# Patient Record
Sex: Female | Born: 1955 | Race: White | Hispanic: No | Marital: Married | Smoking: Never smoker
Health system: Southern US, Community
[De-identification: ages and names within clinical notes are randomized; demographics above are authoritative.]

## PROBLEM LIST (undated history)

## (undated) DIAGNOSIS — M1990 Unspecified osteoarthritis, unspecified site: Secondary | ICD-10-CM

## (undated) DIAGNOSIS — K8020 Calculus of gallbladder without cholecystitis without obstruction: Secondary | ICD-10-CM

## (undated) DIAGNOSIS — E079 Disorder of thyroid, unspecified: Secondary | ICD-10-CM

## (undated) DIAGNOSIS — E785 Hyperlipidemia, unspecified: Secondary | ICD-10-CM

## (undated) DIAGNOSIS — F32A Depression, unspecified: Secondary | ICD-10-CM

## (undated) DIAGNOSIS — F329 Major depressive disorder, single episode, unspecified: Secondary | ICD-10-CM

## (undated) DIAGNOSIS — E039 Hypothyroidism, unspecified: Secondary | ICD-10-CM

## (undated) HISTORY — PX: CHOLECYSTECTOMY: SHX55

## (undated) HISTORY — DX: Calculus of gallbladder without cholecystitis without obstruction: K80.20

## (undated) HISTORY — PX: APPENDECTOMY: SHX54

## (undated) HISTORY — DX: Disorder of thyroid, unspecified: E07.9

## (undated) HISTORY — DX: Depression, unspecified: F32.A

## (undated) HISTORY — DX: Unspecified osteoarthritis, unspecified site: M19.90

## (undated) HISTORY — DX: Hypothyroidism, unspecified: E03.9

## (undated) HISTORY — DX: Major depressive disorder, single episode, unspecified: F32.9

## (undated) HISTORY — PX: VAGINAL HYSTERECTOMY: SUR661

## (undated) HISTORY — DX: Hyperlipidemia, unspecified: E78.5

---

## 2009-10-02 ENCOUNTER — Ambulatory Visit: Payer: Self-pay | Admitting: Family Medicine

## 2009-10-02 DIAGNOSIS — F329 Major depressive disorder, single episode, unspecified: Secondary | ICD-10-CM

## 2009-10-02 DIAGNOSIS — F32A Depression, unspecified: Secondary | ICD-10-CM | POA: Insufficient documentation

## 2009-10-02 DIAGNOSIS — N951 Menopausal and female climacteric states: Secondary | ICD-10-CM | POA: Insufficient documentation

## 2009-10-02 DIAGNOSIS — E039 Hypothyroidism, unspecified: Secondary | ICD-10-CM | POA: Insufficient documentation

## 2009-10-03 ENCOUNTER — Encounter: Payer: Self-pay | Admitting: Family Medicine

## 2009-10-06 LAB — CONVERTED CEMR LAB
ALT: 14 units/L (ref 0–35)
AST: 16 units/L (ref 0–37)
Albumin: 4.3 g/dL (ref 3.5–5.2)
Alkaline Phosphatase: 66 units/L (ref 39–117)
BUN: 12 mg/dL (ref 6–23)
CO2: 27 meq/L (ref 19–32)
Calcium: 9.3 mg/dL (ref 8.4–10.5)
Chloride: 102 meq/L (ref 96–112)
Cholesterol: 222 mg/dL — ABNORMAL HIGH (ref 0–200)
Creatinine, Ser: 0.95 mg/dL (ref 0.40–1.20)
Estradiol: 126.2 pg/mL
FSH: 56.4 milliintl units/mL
Glucose, Bld: 96 mg/dL (ref 70–99)
HDL: 76 mg/dL (ref >39)
LDL Cholesterol: 132 mg/dL — ABNORMAL HIGH (ref 0–99)
LH: 59.8 milliintl units/mL
Potassium: 4.5 meq/L (ref 3.5–5.3)
Progesterone: 0.5 ng/mL
Sodium: 139 meq/L (ref 135–145)
TSH: 3.988 microintl units/mL (ref 0.350–4.500)
Total Bilirubin: 0.5 mg/dL (ref 0.3–1.2)
Total CHOL/HDL Ratio: 2.9
Total Protein: 6.9 g/dL (ref 6.0–8.3)
Triglycerides: 71 mg/dL (ref <150)
VLDL: 14 mg/dL (ref 0–40)

## 2009-10-08 ENCOUNTER — Telehealth: Payer: Self-pay | Admitting: Family Medicine

## 2009-10-13 ENCOUNTER — Encounter: Admission: RE | Admit: 2009-10-13 | Discharge: 2009-10-13 | Payer: Self-pay | Admitting: Family Medicine

## 2009-10-27 ENCOUNTER — Ambulatory Visit: Payer: Self-pay | Admitting: Family Medicine

## 2009-10-28 LAB — CONVERTED CEMR LAB
TSH: 1.821 microintl units/mL (ref 0.350–4.500)
Vit D, 25-Hydroxy: 37 ng/mL (ref 30–89)

## 2009-11-18 ENCOUNTER — Telehealth: Payer: Self-pay | Admitting: Family Medicine

## 2009-12-30 ENCOUNTER — Encounter: Payer: Self-pay | Admitting: Family Medicine

## 2010-03-12 ENCOUNTER — Encounter: Payer: Self-pay | Admitting: Family Medicine

## 2010-03-12 LAB — CONVERTED CEMR LAB: TSH: 1.5 microintl units/mL

## 2010-03-19 ENCOUNTER — Ambulatory Visit: Payer: Self-pay | Admitting: Family Medicine

## 2010-04-06 ENCOUNTER — Telehealth: Payer: Self-pay | Admitting: Family Medicine

## 2010-05-13 ENCOUNTER — Encounter: Payer: Self-pay | Admitting: Family Medicine

## 2010-07-01 ENCOUNTER — Encounter: Payer: Self-pay | Admitting: Family Medicine

## 2010-07-24 ENCOUNTER — Ambulatory Visit: Payer: Self-pay | Admitting: Family Medicine

## 2010-07-24 ENCOUNTER — Encounter: Payer: Self-pay | Admitting: Family Medicine

## 2010-07-24 ENCOUNTER — Encounter
Admission: RE | Admit: 2010-07-24 | Discharge: 2010-07-24 | Payer: Self-pay | Source: Home / Self Care | Attending: Family Medicine | Admitting: Family Medicine

## 2010-07-24 DIAGNOSIS — Z9089 Acquired absence of other organs: Secondary | ICD-10-CM | POA: Insufficient documentation

## 2010-07-24 DIAGNOSIS — M2550 Pain in unspecified joint: Secondary | ICD-10-CM | POA: Insufficient documentation

## 2010-07-24 DIAGNOSIS — M79609 Pain in unspecified limb: Secondary | ICD-10-CM | POA: Insufficient documentation

## 2010-07-28 LAB — CONVERTED CEMR LAB
ANA Titer 1: NEGATIVE
Anti Nuclear Antibody(ANA): POSITIVE — AB
Basophils Absolute: 0 10*3/uL (ref 0.0–0.1)
Basophils Relative: 1 % (ref 0–1)
Eosinophils Absolute: 0.2 10*3/uL (ref 0.0–0.7)
Eosinophils Relative: 3 % (ref 0–5)
HCT: 42.9 % (ref 36.0–46.0)
Hemoglobin: 14.3 g/dL (ref 12.0–15.0)
Lymphocytes Relative: 27 % (ref 12–46)
Lymphs Abs: 1.6 10*3/uL (ref 0.7–4.0)
MCHC: 33.3 g/dL (ref 30.0–36.0)
MCV: 96 fL (ref 78.0–100.0)
Monocytes Absolute: 0.6 10*3/uL (ref 0.1–1.0)
Monocytes Relative: 10 % (ref 3–12)
Neutro Abs: 3.5 10*3/uL (ref 1.7–7.7)
Neutrophils Relative %: 60 % (ref 43–77)
Platelets: 419 10*3/uL — ABNORMAL HIGH (ref 150–400)
RBC: 4.47 M/uL (ref 3.87–5.11)
RDW: 12.7 % (ref 11.5–15.5)
Rhuematoid fact SerPl-aCnc: <20 intl units/mL (ref 0–20)
Sed Rate: 6 mm/hr (ref 0–22)
WBC: 5.8 10*3/uL (ref 4.0–10.5)

## 2010-09-01 ENCOUNTER — Ambulatory Visit
Admission: RE | Admit: 2010-09-01 | Discharge: 2010-09-01 | Payer: Self-pay | Source: Home / Self Care | Attending: Family Medicine | Admitting: Family Medicine

## 2010-09-01 ENCOUNTER — Telehealth: Payer: Self-pay | Admitting: Family Medicine

## 2010-09-01 DIAGNOSIS — J209 Acute bronchitis, unspecified: Secondary | ICD-10-CM | POA: Insufficient documentation

## 2010-09-15 NOTE — Progress Notes (Signed)
Summary: Wellbutrin  Phone Note Call from Patient Call back at Home Phone [phone]   Caller: Patient Call For: Nani Gasser MD Summary of Call: Pt on genmeric Wellbutrin and gets it from a specific company because its the only one that her body can take and [facility] told her the company has hold on it until March- she is out of pills and doesn't know what to do Initial call taken by: Kathlene November,  October 08, 2009 3:11 PM  Follow-up for Phone Call        Will just have to take another brand until then.  Follow-up by: Nani Gasser MD,  October 08, 2009 4:37 PM  Additional Follow-up for Phone Call Additional follow up Details #1::        Pt would like to change med said ya'll had discussed one that helped with menopause. Send to [facility] in [place] Additional Follow-up by: Kathlene November,  October 08, 2009 4:40 PM    Additional Follow-up for Phone Call Additional follow up Details #2::    OK, will start effexor.  Needto f/u in 3 weeks.  Follow-up by: Nani Gasser MD,  October 08, 2009 4:42 PM  New/Updated Medications: VENLAFAXINE HCL 37.5 MG TABS (VENLAFAXINE HCL) Take 1 tablet by mouth two times a day for one week then increase to 2 tabs by mouth two times a day Prescriptions: VENLAFAXINE HCL 37.5 MG TABS (VENLAFAXINE HCL) Take 1 tablet by mouth two times a day for one week then increase to 2 tabs by mouth two times a day  #30 days sup x 0   Entered and Authorized by:   Nani Gasser MD   Signed by:   Nani Gasser MD on 10/08/2009   Method used:   Electronically to        [facility]* (retail)       [address] [place], [place]  [address]       Ph: [phone]       Fax: [phone]   RxID:   [id number]

## 2010-09-15 NOTE — Assessment & Plan Note (Signed)
Summary: NOV: Thyroid, mood   Vital Signs:  Patient profile:   55 year old female Height:      65.5 inches Weight:      149 pounds BMI:     24.51 Pulse rate:   85 / minute BP sitting:   135 / 75  (left arm) Cuff size:   regular  Vitals Entered By: Kathlene November (October 02, 2009 10:51 AM) CC: Np- get established and needs refills on meds Is Patient Diabetic? No   Primary Care Provider:  Nani Gasser MD  CC:  Np- get established and needs refills on meds.  History of Present Illness: Np- get established and needs refills on meds.   [place] Feels like she did when first discovered her thyroid problem.  Feels so tired can't get out of bed. Does feel she is peri-menopuasal. Has post partum thyroiditis in 1988. Was fine until a couple of years ago. Mom is hypothyroid.  No skin changes. NO hair changes.  Last level was checked in one week. No thoughts of suicide. sister committed suicide about 1 year ago. Plans on starting counseling. Plans on starting to work out.  Having carb cravings. Has been gaining weight. Not sleepng well since moved here.   Habits & Providers  Alcohol-Tobacco-Diet     Alcohol drinks/day: <1     Tobacco Status: never  Exercise-Depression-Behavior     Does Patient Exercise: yes     Type of exercise: walk, eliptical, treadmill     Exercise (avg: min/session): <30     Times/week: 3     STD Risk: never     Drug Use: no  Past History:  Past Medical History: None  Past Surgical History: 2 ectopic pregnancies 1980s c/sec 1987 Partial hysterectomy 2000 Cholecystectomy  2009  Family History: Sister commited suicide in 2009, Bipolar Mom, Dad with high cholesterol  Father with HTN Mom was on DES during pregnancy with her  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  Married to [place] with 2 kids.  Oldest son with Aspergers, youngest son with Tourettes, both adopted.   Never Smoked Alcohol  use-yes Drug use-no Regular exercise-yes Smoking Status:  never Does Patient Exercise:  yes STD Risk:  never Drug Use:  no  Review of Systems       + fever/sweats/weakness, +  unexplained weight loss/gain.  No vison changes.  No difficulty hearing/ringing in ears, hay fever/allergies.  No chest pain/discomfort, palpitations.  No Br lump/nipple discharge.  No cough/wheeze.  No blood in BM, nausea/vomiting/diarrhea.  No nighttime urination, leaking urine, unusual vaginal bleeding, discharge (penis or vagina).  No muscle/joint pain. No rash, change in mole.  No HA, memory loss.  No anxiety, sleep d/o, depression.  No easy bruising/bleeding, unexplained lump   Physical Exam  General:  Well-developed,well-nourished,in no acute distress; alert,appropriate and cooperative throughout examination Head:  Normocephalic and atraumatic without obvious abnormalities. No apparent alopecia or balding. Eyes:  No corneal or conjunctival inflammation noted. EOMI. Perrla.  Neck:  No deformities, masses, or tenderness noted. NOTM.  Lungs:  Normal respiratory effort, chest expands symmetrically. Lungs are clear to auscultation, no crackles or wheezes. Heart:  Normal rate and regular rhythm. S1 and S2 normal without gallop, murmur, click, rub or other extra sounds. Skin:  no rashes.   Psych:  Cognition and judgment appear intact. Alert and cooperative with normal attention span and concentration. No apparent delusions, illusions, hallucinations   Impression &  Recommendations:  Problem # 1:  UNSPECIFIED HYPOTHYROIDISM (ICD-244.9) Will check levels and make sure dose is appropriate. Some 9of her sxs may be from depression as well as postmenopause. She would like her hromones rechecked.   Her updated medication list for this problem includes:    Levothroid 75 Mcg Tabs (Levothyroxine sodium) .Marland Kitchen... Take 1 tablet by mouth once a day  Orders: T-TSH ([id number])  Problem # 2:  DEPRESSION (ICD-311) PHQ-9 today  is 11 (moderate). Neg screen for Bipolar. Clearly she has had alot of acute and lifelong stressors. I did encourage counseling esp since she has been thinking about it any way. Also discussed that wellbuytrin may not be enought to really control her sxs right now.  She says the 300mg  dose gave her tremors. Even discussed something like and SNRI like effexor that may help with menopausal sxs and depression at the same. time. Did encourage her to start working out as well.This may help with her carb cravings and poor sleep as well.  Her updated medication list for this problem includes:    Wellbutrin Sr 150 Mg Xr12h-tab (Bupropion hcl) .Marland Kitchen... Take one tablet by mouth once a day  Problem # 3:  MENOPAUSAL SYNDROME (ICD-627.2) il check levels.  Orders: T-Estradiol [id number]) T-Progesterone (30865) T-FSH [id number]) T-LH [id number])  Complete Medication List: 1)  Levothroid 75 Mcg Tabs (Levothyroxine sodium) .... Take 1 tablet by mouth once a day 2)  Wellbutrin Sr 150 Mg Xr12h-tab (Bupropion hcl) .... Take one tablet by mouth once a day 3)  Fruity Chewables Multivitamin Chew (Pediatric multiple vit-c-fa) .... Take one tablet by mouth once a day  Other Orders: T-Comprehensive Metabolic Panel [id number]) T-Lipid Profile ([id number]) T-Mammography Bilateral Screening (87564)

## 2010-09-15 NOTE — Letter (Signed)
Summary: [facility]  [facility]   Imported By: Lanelle Bal 05/21/2010 09:32:28  _____________________________________________________________________  External Attachment:    Type:   Image     Comment:   External Document

## 2010-09-15 NOTE — Assessment & Plan Note (Signed)
Summary: Hosp f/u for appendectomy   Vital Signs:  Patient profile:   55 year old female Height:      65.5 inches Weight:      149 pounds Pulse rate:   78 / minute BP sitting:   129 / 81  (right arm) Cuff size:   regular  Vitals Entered By: Avon Gully CMA, Duncan Dull) (July 24, 2010 9:49 AM) CC: hosp f/u, had appendix taken out in [place]   Primary Care Provider:  Nani Gasser MD  CC:  hosp f/u and had appendix taken out in [place].  History of Present Illness: hosp f/u, had appendix taken out in [place].  Had some wine over thanksgiving and thought initally stomach upset but then became RLQ pain.  Went to the ED. No complications.  Post op doing well.  Dec appetite. No fever. No change in bowels.    Hands have been painful.  Painful in the fingers not the knuckles.  Occ her knees bother her.  Had an rheum w/u 5-10 years ago adn told had elevated ANA but no rheum arthritis.  NO swelling or redness. Usually uses OTC meds for pain relief.   Current Medications (verified): 1)  Synthroid 100 Mcg Tabs (Levothyroxine Sodium) .... Take 1 Tablet By Mouth Once A Day 2)  Fruity Chewables Multivitamin  Chew (Pediatric Multiple Vit-C-Fa) .... Take One Tablet By Mouth Once A Day 3)  Wellbutrin Xl 150 Mg Xr24h-Tab (Bupropion Hcl) .... Take 1 Tablet By Mouth Once A Day  Allergies (verified): 1)  ! Sulfa  Comments:  Nurse/Medical Assistant: The patient's medications and allergies were reviewed with the patient and were updated in the Medication and Allergy Lists. Avon Gully CMA, Duncan Dull) (July 24, 2010 9:50 AM)  Past History:  Past Surgical History: 2 ectopic pregnancies 1980s c/sec 1987 Partial hysterectomy 2000 Cholecystectomy  2009 Appedectomy 06/2010  Social History: Reviewed history from 10/27/2009 and no changes required. 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  Married to [place] with 2 kids. Eliberto Ivory is her sone.  Oldest son with Aspergers, youngest son with Tourettes, both  adopted.   Never Smoked Alcohol use-yes Drug use-no Regular exercise-yes  Physical Exam  General:  Well-developed,well-nourished,in no acute distress; alert,appropriate and cooperative throughout examination Lungs:  Normal respiratory effort, chest expands symmetrically. Lungs are clear to auscultation, no crackles or wheezes. Heart:  Normal rate and regular rhythm. S1 and S2 normal without gallop, murmur, click, rub or other extra sounds. Abdomen:  Some fullness in the RUQ.  Icision are healing well. Nontender. No drainage.  Msk:  Hands; Some of her DIPs are slighly enlarged but no swellin or redness or tenderness.  has some stiffnes and dec flexion of the thumbs bilaterally.  Finger wtih NROM.    Impression & Recommendations:  Problem # 1:  APPENDECTOMY, HX OF (ICD-V45.79) She is doing well overall.  AVoid liftingfor total fo 6 weeks. They aslo did an umbilical hernia repair.   Call if any probems, pain or fever or drainage for the wounds.  and we did call in records from  and [facility] in [place], [place]. These wre reviewed.   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 Will recheck labs and get plain xrays. If OA explained best and safest tx is exercises, keep hands warm, and use Tylneol for pain relief.  Orders: T-*Unlisted Diagnostic X-ray test/procedure ([id number])  Complete Medication List: 1)  Synthroid 100 Mcg Tabs (Levothyroxine  sodium) .... Take 1 tablet by mouth once a day 2)  Fruity Chewables Multivitamin Chew (Pediatric multiple vit-c-fa) .... Take one tablet by mouth once a day 3)  Wellbutrin Xl 150 Mg Xr24h-tab (Bupropion hcl) .... Take 1 tablet by mouth once a day  Other Orders: T-CBC w/Diff ([id number]) T-Sed Rate (Automated) [id number]) T-Rheumatoid Factor [id number]) [place] and Pattern ([id number])  Patient Instructions: 1)  No lifting greater than 10 lbs or bowling for total of 6 weeks post surgery.  2)  Call  if any problems on new onset pain.  3)  We will cal you with your xray and lab results.    Orders Added: 1)  T-*Unlisted Diagnostic X-ray test/procedure [[id number]] 2)  T-CBC w/Diff [[id number]] 3)  T-Sed Rate (Automated) [[id number]] 4)  T-Rheumatoid Factor [[id number]] 5)  T-ANA Titer and Pattern [86039-23905] 6)  Est. Patient Level IV [54627]

## 2010-09-15 NOTE — Letter (Signed)
Summary: Depression Questionnaire/[place]  Depression Questionnaire/[facility]   Imported By: Lanelle Bal 10/07/2009 13:09:00  _____________________________________________________________________  External Attachment:    Type:   Image     Comment:   External Document

## 2010-09-15 NOTE — Progress Notes (Signed)
Summary: Referral to endocrinologists  Phone Note Call from Patient Call back at Home Phone [phone]   Caller: Patient Call For: Stacey Gasser MD Summary of Call: Pt calls and would like a referral to see Dr. Jimmye Norman an endocrinologists in [place] for her thyroid. States still having trouble with it.Dr. Redmond [facility] # [phone] Initial call taken by: Kathlene November,  November 18, 2009 12:47 PM  Follow-up for Phone Call        OK.  Follow-up by: Stacey Gasser MD,  November 18, 2009 12:52 PM

## 2010-09-15 NOTE — Assessment & Plan Note (Signed)
Summary: FU THYROID, Mood   Vital Signs:  Patient profile:   55 year old female Height:      65.5 inches Weight:      150 pounds BMI:     24.67 O2 Sat:      99 % on Room air Pulse rate:   82 / minute BP sitting:   126 / 69  (left arm) Cuff size:   regular  Vitals Entered By: Payton Spark CMA (October 27, 2009 8:29 AM)  O2 Flow:  Room air CC: F/U.    Primary Care Provider:  Nani Gasser MD  CC:  F/U. Marland Kitchen  History of Present Illness: Pt doesn't do well on the brand, generic feels ineffective.  The generic also upsets her stomach so would like to try the branded. More irrtability off th emedication. The effexor was too sedating and made her nauseated.   Current Medications (verified): 1)  Levothroid 88 Mcg Tabs (Levothyroxine Sodium) .... Take 1 Tablet By Mouth Once A Day in The Am 2)  Fruity Chewables Multivitamin  Chew (Pediatric Multiple Vit-C-Fa) .... Take One Tablet By Mouth Once A Day  Allergies (verified): 1)  ! 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  Married to [place] with 2 kids. Eliberto Ivory is her sone.  Oldest son with Aspergers, youngest son with Tourettes, both adopted.   Never Smoked Alcohol use-yes Drug use-no Regular exercise-yes  Physical Exam  General:  Well-developed,well-nourished,in no acute distress; alert,appropriate and cooperative throughout examination Head:  Normocephalic and atraumatic without obvious abnormalities. No apparent alopecia or balding. Lungs:  Normal respiratory effort, chest expands symmetrically. Lungs are clear to auscultation, no crackles or wheezes. Heart:  Normal rate and regular rhythm. S1 and S2 normal without gallop, murmur, click, rub or other extra sounds. Skin:  no rashes.   Cervical Nodes:  No lymphadenopathy noted Psych:  Cognition and judgment appear intact. Alert and cooperative with normal attention span and concentration. No apparent delusions, illusions, hallucinations   Impression &  Recommendations:  Problem # 1:  UNSPECIFIED HYPOTHYROIDISM (ICD-244.9) Due to recheck. Has been sick with URI lately so hasn't been able to exercise and lose weight like she would like.  Her updated medication list for this problem includes:    Levothroid 88 Mcg Tabs (Levothyroxine sodium) .Marland Kitchen... Take 1 tablet by mouth once a day in the am  Orders: T-TSH [id number]) T-Vitamin D (25-Hydroxy) [phone])  Labs Reviewed: TSH: 3.988 (10/03/2009)    Chol: 222 (10/03/2009)   HDL: 76 (10/03/2009)   LDL: 132 (10/03/2009)   TG: 71 (10/03/2009)  Problem # 2:  DEPRESSION (ICD-311)  Not on Effexor. Would like to change to brand wellbutrin until the generic by TEVA is available. New rx sent.  The following medications were removed from the medication list:    Venlafaxine Hcl 37.5 Mg Tabs (Venlafaxine hcl) .Marland Kitchen... Take 1 tablet by mouth two times a day for one week then increase to 2 tabs by mouth two times a day Her updated medication list for this problem includes:    Wellbutrin Xl 150 Mg Xr24h-tab (Bupropion hcl) .Marland Kitchen... Take 1 tablet by mouth once a day  Orders: T-Vitamin D (25-Hydroxy) ([id number])  Complete Medication List: 1)  Levothroid 88 Mcg Tabs (Levothyroxine sodium) .... Take 1 tablet by mouth once a day in the am 2)  Fruity Chewables Multivitamin Chew (Pediatric multiple vit-c-fa) .... Take one tablet by mouth once a day 3)  Wellbutrin Xl 150 Mg Xr24h-tab (Bupropion hcl) .Marland KitchenMarland KitchenMarland Kitchen  Take 1 tablet by mouth once a day Prescriptions: WELLBUTRIN XL 150 MG XR24H-TAB (BUPROPION HCL) Take 1 tablet by mouth once a day Brand medically necessary #30 x 1   Entered and Authorized by:   Nani Gasser MD   Signed by:   Nani Gasser MD on 10/27/2009   Method used:   Electronically to        [facility] (retail)       [address] [place], [place]  [address]       Ph: [phone]       Fax: [phone]   RxID:   [id number]

## 2010-09-15 NOTE — Assessment & Plan Note (Signed)
Summary: sinus infection   Vital Signs:  Patient profile:   55 year old female Height:      65.5 inches Weight:      151 pounds Temp:     99.2 degrees F oral Pulse rate:   88 / minute BP sitting:   124 / 65  (left arm) Cuff size:   regular  Vitals Entered By: Avon Gully CMA, Duncan Dull) (March 19, 2010 11:32 AM) CC: sinus pressure and congestion x 2 weeks   Primary Care Toron Bowring:  Nani Gasser MD  CC:  sinus pressure and congestion x 2 weeks.  History of Present Illness: sinus pressure and congestion x 2 weeks. Chest discomfort as well. Took sinus cold medicine. Teeth have been huring. + HA.  Thought initally allergies. No fever.  Some blood intermittantly form the left nostril.  NO ear pain.   Current Medications (verified): 1)  Levothroid 88 Mcg Tabs (Levothyroxine Sodium) .... Take 1 Tablet By Mouth Once A Day in The Am 2)  Fruity Chewables Multivitamin  Chew (Pediatric Multiple Vit-C-Fa) .... Take One Tablet By Mouth Once A Day 3)  Wellbutrin Xl 150 Mg Xr24h-Tab (Bupropion Hcl) .... Take 1 Tablet By Mouth Once A Day  Allergies (verified): 1)  ! Sulfa  Comments:  Nurse/Medical Assistant: The patient's medications and allergies were reviewed with the patient and were updated in the Medication and Allergy Lists. Avon Gully CMA, Duncan Dull) (March 19, 2010 11:32 AM)  Physical Exam  General:  Well-developed,well-nourished,in no acute distress; alert,appropriate and cooperative throughout examination Head:  Normocephalic and atraumatic without obvious abnormalities. No apparent alopecia or balding. Eyes:  No corneal or conjunctival inflammation noted. EOMI. Perrla.  Ears:  External ear exam shows no significant lesions or deformities.  Otoscopic examination reveals clear canals, tympanic membranes are intact bilaterally without bulging, retraction, inflammation or discharge. Hearing is grossly normal bilaterally. Nose:  External nasal examination shows no deformity  or inflammation. Nasal mucosa are pink and moist without lesion.  Yellow discharge.  Mouth:  Oral mucosa and oropharynx without lesions or exudates.  Teeth in good repair. Neck:  No deformities, masses, or tenderness noted. Lungs:  Normal respiratory effort, chest expands symmetrically. Lungs are clear to auscultation, no crackles or wheezes. Heart:  Normal rate and regular rhythm. S1 and S2 normal without gallop, murmur, click, rub or other extra sounds. Skin:  Skin dry and cracked on her fingertips.  Cervical Nodes:  No lymphadenopathy noted Psych:  Cognition and judgment appear intact. Alert and cooperative with normal attention span and concentration. No apparent delusions, illusions, hallucinations   Impression & Recommendations:  Problem # 1:  SINUSITIS - ACUTE-NOS (ICD-461.9)  Her updated medication list for this problem includes:    Zithromax Z-pak 250 Mg Tabs (Azithromycin) .Marland Kitchen... Take as directed.  Instructed on treatment. Call if symptoms persist or worsen.   Problem # 2:  DRY SKIN (ICD-701.1) Has cracks on he fingertips that are inflammed. Used ot use a cream on it that worked to heal the area in 3-4 days but it was taken off the market. Now using triple abx oint and it is not helping. Will try bactroban. If not helping then can consider other tx.   Complete Medication List: 1)  Synthroid 100 Mcg Tabs (Levothyroxine sodium) .... Take 1 tablet by mouth once a day 2)  Fruity Chewables Multivitamin Chew (Pediatric multiple vit-c-fa) .... Take one tablet by mouth once a day 3)  Wellbutrin Xl 150 Mg Xr24h-tab (Bupropion hcl) .Marland KitchenMarland KitchenMarland Kitchen  Take 1 tablet by mouth once a day 4)  Zithromax Z-pak 250 Mg Tabs (Azithromycin) .... Take as directed. 5)  Bactroban 2 % Oint (Mupirocin) .... Apply two times a day to affected area.  Patient Instructions: 1)  Call if not better in 10 days.  Prescriptions: BACTROBAN 2 % OINT (MUPIROCIN) Apply two times a day to affected area.  #1 tube. x 0   Entered  and Authorized by:   Nani Gasser MD   Signed by:   Nani Gasser MD on 03/19/2010   Method used:   Electronically to        [facility] (retail)       [address] [place], [place]  [address]       Ph: [phone]       Fax: [phone]   RxID:   [id number] ZITHROMAX Z-PAK 250 MG TABS (AZITHROMYCIN) Take as directed.  #1 x 0   Entered and Authorized by:   Nani Gasser MD   Signed by:   Nani Gasser MD on 03/19/2010   Method used:   Electronically to        [facility] (retail)       [address] [place], [place]  [address]       Ph: [phone]       Fax: [phone]   RxID:   [id number] AMOXICILLIN 875 MG TABS (AMOXICILLIN) Take 1 tablet by mouth two times a day for 10 days.  #20 x 0   Entered and Authorized by:   Nani Gasser MD   Signed by:   Nani Gasser MD on 03/19/2010   Method used:   Electronically to        [facility] (retail)       [address] [place], [place]  [address]       Ph: [phone]       Fax: [phone]   RxID:   [id number]

## 2010-09-15 NOTE — Progress Notes (Signed)
Summary: sinus infec  Phone Note Call from Patient   Caller: Patient Call For: Nani Gasser MD Summary of Call: Pt called and states as soon as she finished the abx her head started hurting again and face hurts, no constant sinus drainage. please advise Initial call taken by: Avon Gully CMA, Duncan Dull),  April 06, 2010 3:49 PM  Follow-up for Phone Call        Will call in different ABX. If not better will need to f/u Follow-up by: Nani Gasser MD,  April 06, 2010 4:10 PM  Additional Follow-up for Phone Call Additional follow up Details #1::        called pt and notified of results Additional Follow-up by: Avon Gully CMA, Duncan Dull),  April 06, 2010 4:57 PM    New/Updated Medications: DOXYCYCLINE HYCLATE 100 MG CAPS (DOXYCYCLINE HYCLATE) Take 1 tablet by mouth two times a day for 10 days Prescriptions: DOXYCYCLINE HYCLATE 100 MG CAPS (DOXYCYCLINE HYCLATE) Take 1 tablet by mouth two times a day for 10 days  #20 x 0   Entered and Authorized by:   Nani Gasser MD   Signed by:   Nani Gasser MD on 04/06/2010   Method used:   Electronically to        [facility] (retail)       [address] [place], [place]  [address]       Ph: [phone]       Fax: [phone]   RxID:   [id number]

## 2010-09-15 NOTE — Letter (Signed)
Summary: Records Dated 11-19-03 thru 12-18-07/Joy Artis Flock MD  Records Dated 11-19-03 thru 12-18-07/Joy Artis Flock MD   Imported By: Lanelle Bal 10/30/2009 12:18:42  _____________________________________________________________________  External Attachment:    Type:   Image     Comment:   External Document

## 2010-09-17 NOTE — Progress Notes (Signed)
Summary: eczema  Phone Note Call from Patient   Caller: Patient Call For: Stacey Gasser MD Summary of Call: pt wanted to know if you were going to send a rx in for her eczema to her pharm Initial call taken by: Avon Gully CMA, Duncan Dull),  September 01, 2010 12:13 PM  Follow-up for Phone Call        Sorry, yes, just send over.  Follow-up by: Stacey Gasser MD,  September 01, 2010 12:14 PM  Additional Follow-up for Phone Call Additional follow up Details #1::        pt notifed Additional Follow-up by: Avon Gully CMA, Duncan Dull),  September 01, 2010 12:17 PM    New/Updated Medications: TRIAMCINOLONE ACETONIDE 0.1 % OINT (TRIAMCINOLONE ACETONIDE) Apply to area on her ears two times a day for 2 weeks. Prescriptions: TRIAMCINOLONE ACETONIDE 0.1 % OINT (TRIAMCINOLONE ACETONIDE) Apply to area on her ears two times a day for 2 weeks.  #20 grams x 0   Entered and Authorized by:   Stacey Gasser MD   Signed by:   Stacey Gasser MD on 09/01/2010   Method used:   Electronically to        [facility]* (retail)       [address] [place], [place]  [address]       Ph: [phone]       Fax: [phone]   RxID:   [id number]

## 2010-09-17 NOTE — Assessment & Plan Note (Signed)
Summary: Stacey Contreras   Vital Signs:  Patient profile:   55 year old female Height:      65.5 inches Weight:      151 pounds O2 Sat:      96 % on Room air Temp:     98.9 degrees F oral Pulse rate:   87 / minute BP sitting:   120 / 68  (right arm) Cuff size:   regular  Vitals Entered By: Avon Gully CMA, Duncan Dull) (September 01, 2010 9:58 AM)  O2 Flow:  Room air CC: lungs feel like they are burning, fatigue   Primary Care Provider:  Nani Gasser MD  CC:  lungs feel like they are burning and fatigue.  History of Present Illness: lungs feel like they are burning, fatigue. pain at the bases of the lungs.  Worse on the left. Still feels tired and run down all the time. The discomfort is worse with walking. Feels SOB with going up stairs. Occ dizy with the SOB.  Very easily fatigued.  Had bronchitis 2 years ago.. nThis feels similar. No fever.  Night sweat st 1:30 and 3:30 almost every night.  Ear pressure. Some mild runny nose for a couple of days. No ST.    Current Medications (verified): 1)  Synthroid 100 Mcg Tabs (Levothyroxine Sodium) .... Take 1 Tablet By Mouth Once A Day 2)  Fruity Chewables Multivitamin  Chew (Pediatric Multiple Vit-C-Fa) .... Take One Tablet By Mouth Once A Day 3)  Wellbutrin Xl 150 Mg Xr24h-Tab (Bupropion Hcl) .... Take 1 Tablet By Mouth Once A Day  Allergies (verified): 1)  ! Sulfa  Comments:  Nurse/Medical Assistant: The patient's medications and allergies were reviewed with the patient and were updated in the Medication and Allergy Lists. Avon Gully CMA, Duncan Dull) (September 01, 2010 9:59 AM)  Past History:  Past Medical History: Sees Dr. Darlyn Read for her thyroid  Physical Exam  General:  Well-developed,well-nourished,in no acute distress; alert,appropriate and cooperative throughout examination Head:  Normocephalic and atraumatic without obvious abnormalities. No apparent alopecia or balding. Eyes:  No corneal or conjunctival  inflammation noted. EOMI. Perrla.  Ears:  External ear exam shows no significant lesions or deformities.  Otoscopic examination reveals clear canals, tympanic membranes are intact bilaterally without bulging, retraction, inflammation or discharge. Hearing is grossly normal bilaterally. Nose:  External nasal examination shows no deformity or inflammation. Nasal mucosa are pink and moist without lesions or exudates. Mouth:  Oral mucosa and oropharynx without lesions or exudates.  Teeth in good repair. Neck:  No deformities, masses, or tenderness noted. Lungs:  Normal respiratory effort, chest expands symmetrically. Lungs are clear to auscultation, no crackles or wheezes. Heart:  Normal rate and regular rhythm. S1 and S2 normal without gallop, murmur, click, rub or other extra sounds. Pulses:  RAdial 2+  Skin:  no rashes.   Cervical Nodes:  No lymphadenopathy noted Psych:  Cognition and judgment appear intact. Alert and cooperative with normal attention span and concentration. No apparent delusions, illusions, hallucinations   Impression & Recommendations:  Problem # 1:  ACUTE BRONCHITIS (ICD-466.0) Dsicussed will go ahead and treat. If not better in one week then consider CXR to rule out PNA. Also consider pleurisy since she has not had a fever, though she is having sweats at night.  Call if gets more SOB.  Offered inhaler but she declined.   Her updated medication list for this problem includes:    Doxycycline Hyclate 100 Mg Caps (Doxycycline hyclate) .Marland Kitchen... Take 1 tablet  by mouth two times a day for 10 days  Complete Medication List: 1)  Synthroid 100 Mcg Tabs (Levothyroxine sodium) .... Take 1 tablet by mouth once a day 2)  Fruity Chewables Multivitamin Chew (Pediatric multiple vit-c-fa) .... Take one tablet by mouth once a day 3)  Wellbutrin Xl 150 Mg Xr24h-tab (Bupropion hcl) .... Take 1 tablet by mouth once a day 4)  Doxycycline Hyclate 100 Mg Caps (Doxycycline hyclate) .... Take 1 tablet  by mouth two times a day for 10 days  Patient Instructions: 1)  Call if not better in one week and will consider CXR at that time.  Prescriptions: DOXYCYCLINE HYCLATE 100 MG CAPS (DOXYCYCLINE HYCLATE) Take 1 tablet by mouth two times a day for 10 days  #20 x 0   Entered and Authorized by:   Nani Gasser MD   Signed by:   Nani Gasser MD on 09/01/2010   Method used:   Electronically to        [facility]* (retail)       [address] [place], [place]  [address]       Ph: [phone]       Fax: [phone]   RxID:   [id number]    Orders Added: 1)  Est. Patient Level IV [59563]

## 2010-09-17 NOTE — Letter (Signed)
Summary: Instructions/[facility]   Imported By: Lanelle Bal 08/04/2010 12:13:14  _____________________________________________________________________  External Attachment:    Type:   Image     Comment:   External Document

## 2010-09-30 ENCOUNTER — Other Ambulatory Visit: Payer: Self-pay | Admitting: Obstetrics & Gynecology

## 2010-09-30 ENCOUNTER — Encounter (INDEPENDENT_AMBULATORY_CARE_PROVIDER_SITE_OTHER): Payer: BC Managed Care – PPO | Admitting: Obstetrics & Gynecology

## 2010-09-30 DIAGNOSIS — N951 Menopausal and female climacteric states: Secondary | ICD-10-CM

## 2010-09-30 DIAGNOSIS — N924 Excessive bleeding in the premenopausal period: Secondary | ICD-10-CM

## 2010-09-30 DIAGNOSIS — Z1231 Encounter for screening mammogram for malignant neoplasm of breast: Secondary | ICD-10-CM

## 2010-09-30 DIAGNOSIS — Z780 Asymptomatic menopausal state: Secondary | ICD-10-CM

## 2010-09-30 DIAGNOSIS — Z01419 Encounter for gynecological examination (general) (routine) without abnormal findings: Secondary | ICD-10-CM

## 2010-10-13 ENCOUNTER — Encounter: Payer: Self-pay | Admitting: Family Medicine

## 2010-10-20 ENCOUNTER — Ambulatory Visit
Admission: RE | Admit: 2010-10-20 | Discharge: 2010-10-20 | Disposition: A | Discharge status: Home / Self Care | Payer: BC Managed Care – PPO | Source: Ambulatory Visit | Attending: Obstetrics & Gynecology | Admitting: Obstetrics & Gynecology

## 2010-10-20 ENCOUNTER — Telehealth: Payer: Self-pay | Admitting: Family Medicine

## 2010-10-20 DIAGNOSIS — Z780 Asymptomatic menopausal state: Secondary | ICD-10-CM

## 2010-10-20 DIAGNOSIS — Z1231 Encounter for screening mammogram for malignant neoplasm of breast: Secondary | ICD-10-CM

## 2010-10-22 ENCOUNTER — Telehealth: Payer: Self-pay | Admitting: Family Medicine

## 2010-10-27 NOTE — Progress Notes (Signed)
  Phone Note Call from Patient   Caller: Patient Call For: Stacey Gasser MD Summary of Call: pt called and states the new rx for wellbutrin will not be covered by the insurance since she just got one filled even though it was the not the brand name. Pt wants to know if we can write rx for 300mg  and she just cut them in half.   Initial call taken by: Avon Gully CMA, Duncan Dull),  October 22, 2010 9:11 AM  Follow-up for Phone Call        They are extended releast so can't cut in half.  Can write for two times a day dose if she would like.  Follow-up by: Stacey Gasser MD,  October 22, 2010 9:22 AM  Additional Follow-up for Phone Call Additional follow up Details #1::        pt notified and ok with this Additional Follow-up by: Avon Gully CMA, Duncan Dull),  October 22, 2010 11:48 AM    New/Updated Medications: WELLBUTRIN XL 150 MG XR24H-TAB (BUPROPION HCL) Take 1 tablet by mouth two times a day [BMN] Prescriptions: WELLBUTRIN XL 150 MG XR24H-TAB (BUPROPION HCL) Take 1 tablet by mouth two times a day Brand medically necessary #30 x 0   Entered by:   Avon Gully CMA, (AAMA)   Authorized by:   Stacey Gasser MD   Signed by:   Avon Gully CMA, Duncan Dull) on 10/22/2010   Method used:   Electronically to        [facility]* (retail)       [address] [place], [place]  [address]       Ph: [phone]       Fax: [phone]   RxID:   [id number]

## 2010-10-27 NOTE — Progress Notes (Signed)
Summary: Pt req a call back about meds  Phone Note Call from Patient   Summary of Call: Pt states her prescription welboutrin was sent as generic prescription and she need brand prescription for welboutrin sent in. Pt states she was charged for the generic. Pt req you to call her back [phone] Initial call taken by: Janeal Holmes,  October 20, 2010 9:33 AM  Follow-up for Phone Call        called and left message on pt vm Follow-up by: Avon Gully CMA, Duncan Dull),  October 20, 2010 4:15 PM    Prescriptions: WELLBUTRIN XL 150 MG XR24H-TAB (BUPROPION HCL) Take 1 tablet by mouth once a day Brand medically necessary #90 Each x 3   Entered by:   Avon Gully CMA, (AAMA)   Authorized by:   Nani Gasser MD   Signed by:   Avon Gully CMA, Duncan Dull) on 10/20/2010   Method used:   Electronically to        [facility] (retail)       [address] [place], [place]  [address]       Ph: [phone]       Fax: [phone]   RxID:   [id number]

## 2010-11-06 NOTE — Assessment & Plan Note (Signed)
NAMETOYA, PALACIOS                 ACCOUNT NO.:  [id number]  MEDICAL RECORD NO.:  [id number]          PATIENT TYPE:  LOCATION:  [facility] [place]           FACILITY:  PHYSICIAN:  Allie Bossier, MD             DATE OF BIRTH:  DATE OF SERVICE:                                 CLINIC NOTE  Ms. Kennith Center is a 55 year old married white G6, P1 with 2 adopted living children.  She has two 14 year old sons who are both autistic.  She comes in here for a new patient exam.  Her main complaint is that of 6 months history of menopausal symptoms.  She has the expected night sweats, hot flashes, severely dry vagina.  She, just 3 days ago, started a medication called OstaDerm which is a cream to be used 2-3 times a day, contains plant estrogen.  She got this prescription from her previous gynecologist who lives in [place].  PAST MEDICAL HISTORY:  She is now on the overweight category, depression, and hypothyroidism since approximately 2007.  REVIEW OF SYSTEMS:  She moved here from [place] about a year ago due to her husband's job change.  Dr. Jimmye Norman is her endocrinologist.  She had been married for 30 years.  She is a Futures trader.  She has dyspareunia due to dryness.  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  Other surgeries, TAH secondary to DUB, cholecystectomy, appendectomy, C-section, D and C x2, and endometrial ablation.  FAMILY HISTORY:  Negative for breast, GYN, and colon malignancies, but positive for a suicide in her sister and a suicide attempt in her father.  No latex allergies.  SOCIAL HISTORY:  She drinks alcohol on a rare occasions.  Drug allergy is SULFA.  MEDICATIONS: 1. Synthroid 1000 mcg daily. 2. Wellbutrin 150 mg daily. 3. OstaDerm 2-3 times a day.  PHYSICAL EXAMINATION:  GENERAL:  Well-nourished, well-hydrated pleasant white female. VITAL SIGNS:  Height 5 feet 4 inches, weight 154, blood  pressure 145/76, pulse 87. HEENT:  Normal. BREASTS:  Normal bilaterally. HEART:  Regular rate rhythm. LUNGS:  Clear to auscultation bilaterally. ABDOMEN:  Benign. GU:  External genitalia, moderate-to-severe atrophy.  No lesions. Vagina, atrophy but no lesions.  Bimanual exam reveals no masses and nontender.  ASSESSMENT AND PLAN: 1. Annual exam.  I have scheduled a mammogram and recommended self-     breast and self-vulvar exams monthly. 2. Menopausal symptoms.  If her OstaDerm does not work, then I would     recommend we wean her off her Wellbutrin and start a SSRI for     menopausal symptoms.  Also, recommended Astroglide for the vaginal     dryness with intercourse.     Allie Bossier, MD    MCD/MEDQ  D:  09/30/2010  T:  10/01/2010  Job:  [id number]

## 2010-11-16 ENCOUNTER — Encounter: Payer: Self-pay | Admitting: Family Medicine

## 2010-11-27 ENCOUNTER — Encounter: Payer: Self-pay | Admitting: Family Medicine

## 2011-01-22 ENCOUNTER — Encounter: Payer: Self-pay | Admitting: Family Medicine

## 2011-01-22 ENCOUNTER — Telehealth: Payer: Self-pay | Admitting: Family Medicine

## 2011-01-22 ENCOUNTER — Ambulatory Visit (INDEPENDENT_AMBULATORY_CARE_PROVIDER_SITE_OTHER): Payer: BC Managed Care – PPO | Admitting: Family Medicine

## 2011-01-22 DIAGNOSIS — B009 Herpesviral infection, unspecified: Secondary | ICD-10-CM

## 2011-01-22 MED ORDER — VALACYCLOVIR HCL 500 MG PO TABS: 500.0000 mg | ORAL_TABLET | Freq: Two times a day (BID) | ORAL | Status: DC

## 2011-01-22 NOTE — Patient Instructions (Signed)
Call if not resolving in 10 days.

## 2011-01-22 NOTE — Progress Notes (Signed)
  Subjective:    Patient ID: Stacey Contreras, female    DOB: 11/11/1955, 55 y.o.   MRN: [id number]  HPI somes rash comes and goes but this time it is not going away. Has chills and fatigue.  This breakout started 2 weeks ago. Having some low back pain.  Had similar rash 14 years ago.    Review of Systems     Objective:   Physical Exam She does have a 0.5 cm erythematous papule with a scab in the center.  Only single lesion.        Assessment & Plan:  Likely herpes simplex based on exam and hisotry.  There is nothing to really get a good culture to confirm. Will tx with antiviral. Call if not resolving in 10 days.

## 2011-02-05 ENCOUNTER — Encounter: Payer: Self-pay | Admitting: Family Medicine

## 2011-02-05 ENCOUNTER — Telehealth: Payer: Self-pay | Admitting: Family Medicine

## 2011-02-05 ENCOUNTER — Ambulatory Visit
Admission: RE | Admit: 2011-02-05 | Discharge: 2011-02-05 | Disposition: A | Discharge status: Home / Self Care | Payer: BC Managed Care – PPO | Source: Ambulatory Visit | Attending: Family Medicine | Admitting: Family Medicine

## 2011-02-05 ENCOUNTER — Ambulatory Visit (INDEPENDENT_AMBULATORY_CARE_PROVIDER_SITE_OTHER): Payer: BC Managed Care – PPO | Admitting: Family Medicine

## 2011-02-05 DIAGNOSIS — M898X6 Other specified disorders of bone, lower leg: Secondary | ICD-10-CM

## 2011-02-05 DIAGNOSIS — M25569 Pain in unspecified knee: Secondary | ICD-10-CM

## 2011-02-05 DIAGNOSIS — M79609 Pain in unspecified limb: Secondary | ICD-10-CM

## 2011-02-05 DIAGNOSIS — M79606 Pain in leg, unspecified: Secondary | ICD-10-CM

## 2011-02-05 LAB — D-DIMER, QUANTITATIVE: D-Dimer, Quant: 0.38 ug{FEU}/mL (ref 0.00–0.48)

## 2011-02-05 NOTE — Patient Instructions (Signed)
Ace wrap for compression and Aspirin once a day. We will call you with the d-dimer and the xray result.

## 2011-02-05 NOTE — Telephone Encounter (Addendum)
Call pt: D-dimer is normal so no sign of blood clot. Xray is normal. Start vitamin D 2000iu as well as the aspirin and the ACE wrap.

## 2011-02-05 NOTE — Telephone Encounter (Signed)
Pt aware of the above  

## 2011-02-05 NOTE — Progress Notes (Signed)
  Subjective:    Patient ID: Stacey Contreras, female    DOB: 10-Jan-1956, 55 y.o.   MRN: [id number]  HPI  Left shin pain x 1 week.  Did some cleaning last wee and felt like had some shin splints.  The Right is now better. But stil lhaving swelling and pain in her left lower leg.  Worse when she walks.  He did ice the area. Now starting to feeling tingling over the area. Worse with walkin. Better at rest. Yesterday so painfuil almost couldn't bear weight on her leg. No trauma or falls.   Review of Systems     Objective:   Physical Exam  Constitutional: She appears well-developed and well-nourished.  Musculoskeletal:       Knee and ankle with NROM. No ankel swelling. Seh does have some mild swelling over the distal lateral tibia but no erythema.  She is very tender over the lower tibia (shin) in multipel places.            Assessment & Plan:  Leg pain - consider superficial thrombophlebitis thought doesn't have any redness. Still rec ACE wrap and ASA daily.  Also recommend xray since very tendre over the bone and will get a d-dimer even thought DVT is extremely unlikely.

## 2011-04-24 ENCOUNTER — Encounter: Payer: Self-pay | Admitting: Emergency Medicine

## 2011-04-24 ENCOUNTER — Inpatient Hospital Stay (INDEPENDENT_AMBULATORY_CARE_PROVIDER_SITE_OTHER)
Admission: RE | Admit: 2011-04-24 | Discharge: 2011-04-24 | Disposition: A | Discharge status: Home / Self Care | Payer: BC Managed Care – PPO | Source: Ambulatory Visit | Attending: Emergency Medicine | Admitting: Emergency Medicine

## 2011-04-24 DIAGNOSIS — J069 Acute upper respiratory infection, unspecified: Secondary | ICD-10-CM

## 2011-05-07 ENCOUNTER — Telehealth: Payer: Self-pay | Admitting: Family Medicine

## 2011-05-07 ENCOUNTER — Encounter: Payer: Self-pay | Admitting: Family Medicine

## 2011-05-07 ENCOUNTER — Ambulatory Visit (INDEPENDENT_AMBULATORY_CARE_PROVIDER_SITE_OTHER): Payer: BC Managed Care – PPO | Admitting: Family Medicine

## 2011-05-07 VITALS — BP 128/74 | HR 85 | Temp 98.1°F | Wt 151.0 lb

## 2011-05-07 DIAGNOSIS — E785 Hyperlipidemia, unspecified: Secondary | ICD-10-CM

## 2011-05-07 DIAGNOSIS — E039 Hypothyroidism, unspecified: Secondary | ICD-10-CM

## 2011-05-07 DIAGNOSIS — J329 Chronic sinusitis, unspecified: Secondary | ICD-10-CM

## 2011-05-07 MED ORDER — AMOXICILLIN-POT CLAVULANATE 875-125 MG PO TABS: 1.0000 | ORAL_TABLET | Freq: Two times a day (BID) | ORAL | Status: AC

## 2011-05-07 NOTE — Telephone Encounter (Signed)
Pt called and was [facility] ago at the [facility] because we didn't have any appts.  Was given Doxy for infection.  Now, pt  Has completed med but still C/O H/A, sinus pressure, ears hurts, tired, and doesn't feel well. Plan:  Routed to Dr. Linford Arnold.  Can we send another Ab Tx? Or should we see.  The 1st available appt is next Tuesday. Stacey Newcomer, LPN Domingo Dimes

## 2011-05-07 NOTE — Patient Instructions (Signed)
Consider starting zyrtec or claritin.   Start with 1 spray in each nostril daily.

## 2011-05-07 NOTE — Progress Notes (Signed)
  Subjective:    Patient ID: Stacey Contreras, female    DOB: 07/24/1956, 55 y.o.   MRN: [id number]  HPI Having pressure behind both eyes for 4 weeks.  Now sneezing and having post nasl drip. Went to UC and put on doxy and felt better on it but not completely resolved.  Then started getting worse.  Chest pain with deep breaths.  Occ cough. No fever.  No ear pain and pressure.  Teeth is hurting.    Review of Systems     Objective:   Physical Exam  Constitutional: She is oriented to person, place, and time. She appears well-developed and well-nourished.  HENT:  Head: Normocephalic and atraumatic.  Right Ear: External ear normal.  Left Ear: External ear normal.  Nose: Nose normal.  Mouth/Throat: Oropharynx is clear and moist.       TMs and canals are clear. Turbinates are mildly swollen.   Eyes: Conjunctivae and EOM are normal. Pupils are equal, round, and reactive to light.  Neck: Neck supple. No thyromegaly present.  Cardiovascular: Normal rate, regular rhythm and normal heart sounds.   Pulmonary/Chest: Effort normal and breath sounds normal. She has no wheezes.  Lymphadenopathy:    She has no cervical adenopathy.  Neurological: She is alert and oriented to person, place, and time.  Skin: Skin is warm and dry.  Psychiatric: She has a normal mood and affect. Her behavior is normal.          Assessment & Plan:  Sinusitis - likely bacterial. Change to augmentin. Add a nasal steroid spray. Call if not better in one week.  Start an antihistamine since she does have a hx of rag weed allergies.   FU for labs when feeling better.

## 2011-05-07 NOTE — Telephone Encounter (Signed)
Pt notified that Dr. Linford Arnold would see her this afternoon.  Double booked schedule at 2:30pm.  Front office [place] notified to add the pt on. Jarvis Newcomer, LPN [facility]ingo Dimes

## 2011-05-07 NOTE — Telephone Encounter (Signed)
Can double book her for this afternoon.

## 2011-06-29 ENCOUNTER — Other Ambulatory Visit: Payer: Self-pay | Admitting: Family Medicine

## 2011-07-19 NOTE — Progress Notes (Signed)
Summary: congestion/TM(RM3)   Vital Signs:  Patient Profile:   55 Years Old Female CC:      LUNGS HURT Height:     64.5 inches Weight:      149 pounds O2 Sat:      98 % O2 treatment:    Room Air Temp:     98.7 degrees F oral Pulse rate:   81 / minute Resp:     18 per minute BP sitting:   133 / 77  (left arm)  Vitals Entered By: Linton Flemings RN (April 24, 2011 4:48 PM)                  Updated Prior Medication List: SYNTHROID 100 MCG TABS (LEVOTHYROXINE SODIUM) Take 1 tablet by mouth once a day [BMN] FRUITY CHEWABLES MULTIVITAMIN  CHEW (PEDIATRIC MULTIPLE VIT-C-FA) take one tablet by mouth once a day WELLBUTRIN XL 150 MG XR24H-TAB (BUPROPION HCL) Take 1 tablet by mouth two times a day [BMN] TRIAMCINOLONE ACETONIDE 0.1 % OINT (TRIAMCINOLONE ACETONIDE) Apply to area on her ears two times a day for 2 weeks.  Current Allergies (reviewed today): ! SULFAHistory of Present Illness History from: patient Chief Complaint: LUNGS HURT History of Present Illness: 55 Years Old Female complains of onset of cold symptoms for 2 weeks.  Mickenzie has baseline seasonal allergies ever since moving to [place] from [place]. No sore throat No cough No pleuritic pain No wheezing + nasal congestion + post-nasal drainage + sinus pain/pressure +chest congestion No itchy/red eyes No earache No hemoptysis No SOB No chills/sweats No fever No nausea No vomiting No abdominal pain No diarrhea No skin rashes No fatigue No myalgias No headache   REVIEW OF SYSTEMS Constitutional Symptoms       Complains of fatigue.     Denies fever, chills, night sweats, weight loss, and weight gain.  Eyes       Denies change in vision, eye pain, eye discharge, glasses, contact lenses, and eye surgery. Ear/Nose/Throat/Mouth       Complains of ear pain, frequent nose bleeds, sinus problems, and hoarseness.      Denies hearing loss/aids, change in hearing, ear discharge, dizziness, frequent runny nose, sore  throat, and tooth pain or bleeding.      Comments: RIGHT EAR PAIN Respiratory       Complains of bronchitis.      Denies dry cough, productive cough, wheezing, shortness of breath, asthma, and emphysema/COPD.  Cardiovascular       Denies murmurs, chest pain, and tires easily with exhertion.    Gastrointestinal       Denies stomach pain, nausea/vomiting, diarrhea, constipation, blood in bowel movements, and indigestion. Genitourniary       Denies painful urination, kidney stones, and loss of urinary control. Neurological       Denies paralysis, seizures, and fainting/blackouts. Musculoskeletal       Denies muscle pain, joint pain, joint stiffness, decreased range of motion, redness, swelling, muscle weakness, and gout.  Skin       Denies bruising, unusual mles/lumps or sores, and hair/skin or nail changes.  Psych       Denies mood changes, temper/anger issues, anxiety/stress, speech problems, depression, and sleep problems. Other Comments: STATES SHE FEELS HER RIGHT LUNG FLUTTERS, IT JUST HURTS. DENIES COUGH.    Past History:  Past Medical History: Reviewed history from 09/01/2010 and no changes required. Sees Dr. Darlyn Read for her thyroid  Past Surgical History: Reviewed history from 07/24/2010 and no changes  required. 2 ectopic pregnancies 1980s c/sec 1987 Partial hysterectomy 2000 Cholecystectomy  2009 Appedectomy 06/2010  Family History: Reviewed history from 10/02/2009 and no changes required. Sister commited suicide in 2009, Bipolar Mom, Dad with high cholesterol  Father with HTN Mom was on DES during pregnancy with her  Social History: Reviewed history from 10/27/2009 and no changes required. 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  Married to [place] with 2 kids. Eliberto Ivory is her sone.  Oldest son with Aspergers, youngest son with Tourettes, both adopted.   Never Smoked Alcohol use-yes Drug use-no Regular exercise-yes Physical Exam General appearance: well developed,  well nourished, no acute distress Ears: normal, no lesions or deformities Nasal: mucosa pink, nonedematous, no septal deviation, turbinates normal Oral/Pharynx: tongue normal, posterior pharynx without erythema or exudate Chest/Lungs: no rales, wheezes, or rhonchi bilateral, breath sounds equal without effort Heart: regular rate and  rhythm, no murmur MSE: oriented to time, place, and person Assessment New Problems: UPPER RESPIRATORY INFECTION, ACUTE (ICD-465.9)   Plan New Medications/Changes: DOXYCYCLINE HYCLATE 100 MG CAPS (DOXYCYCLINE HYCLATE) 1 by mouth two times a day for 10 days  #20 x 0, 04/24/2011, Hoyt Koch MD  New Orders: New Patient Level III [phone] Planning Comments:   1)  Take the prescribed antibiotic as instructed.  Last year was given Zpak which didn't help and then Doxy which did help.  It was about the same time as last year, so I wonder if allergic rhinitis is the cause.  If recurrent, consider Flonase, etc. 2)  Use nasal saline solution (over the counter) at least 3 times a day. 3)  Use over the counter decongestants like Zyrtec-D every 12 hours as needed to help with congestion. 4)  Can take tylenol every 6 hours or motrin every 8 hours for pain or fever. 5)  Follow up with your primary doctor  if no improvement in 5-7 days, sooner if increasing pain, fever, or new symptoms.    The patient and/or caregiver has been counseled thoroughly with regard to medications prescribed including dosage, schedule, interactions, rationale for use, and possible side effects and they verbalize understanding.  Diagnoses and expected course of recovery discussed and will return if not improved as expected or if the condition worsens. Patient and/or caregiver verbalized understanding.  Prescriptions: DOXYCYCLINE HYCLATE 100 MG CAPS (DOXYCYCLINE HYCLATE) 1 by mouth two times a day for 10 days  #20 x 0   Entered and Authorized by:   Hoyt Koch MD   Signed by:   Hoyt Koch MD on 04/24/2011   Method used:   Print then Give to Patient   RxID:   [id number]   Orders Added: 1)  New Patient Level III [696[address]]

## 2011-07-22 LAB — COMPLETE METABOLIC PANEL WITH GFR
ALT: 22 U/L (ref 0–35)
AST: 20 U/L (ref 0–37)
Albumin: 4.2 g/dL (ref 3.5–5.2)
Alkaline Phosphatase: 79 U/L (ref 39–117)
BUN: 12 mg/dL (ref 6–23)
CO2: 28 mEq/L (ref 19–32)
Calcium: 9.5 mg/dL (ref 8.4–10.5)
Chloride: 106 mEq/L (ref 96–112)
Creat: 0.96 mg/dL (ref 0.50–1.10)
GFR, Est African American: 78 mL/min (ref >60)
GFR, Est Non African American: 67 mL/min (ref >60)
Glucose, Bld: 90 mg/dL (ref 70–99)
Potassium: 4.7 mEq/L (ref 3.5–5.3)
Sodium: 140 mEq/L (ref 135–145)
Total Bilirubin: 0.5 mg/dL (ref 0.3–1.2)
Total Protein: 6.8 g/dL (ref 6.0–8.3)

## 2011-07-22 LAB — LIPID PANEL
Cholesterol: 231 mg/dL — ABNORMAL HIGH (ref 0–200)
HDL: 60 mg/dL (ref >39)
LDL Cholesterol: 158 mg/dL — ABNORMAL HIGH (ref 0–99)
Total CHOL/HDL Ratio: 3.9 Ratio
Triglycerides: 66 mg/dL (ref <150)
VLDL: 13 mg/dL (ref 0–40)

## 2011-07-22 LAB — TSH: TSH: 0.46 u[IU]/mL (ref 0.350–4.500)

## 2011-07-26 ENCOUNTER — Other Ambulatory Visit: Payer: Self-pay | Admitting: Family Medicine

## 2011-08-30 ENCOUNTER — Other Ambulatory Visit: Payer: Self-pay | Admitting: *Deleted

## 2011-08-30 MED ORDER — SYNTHROID 112 MCG PO TABS: 112.0000 ug | ORAL_TABLET | Freq: Every day | ORAL | Status: DC

## 2011-08-30 MED ORDER — WELLBUTRIN XL 150 MG PO TB24: 150.0000 mg | ORAL_TABLET | Freq: Two times a day (BID) | ORAL | Status: DC

## 2011-08-31 ENCOUNTER — Ambulatory Visit (INDEPENDENT_AMBULATORY_CARE_PROVIDER_SITE_OTHER): Payer: BC Managed Care – PPO | Admitting: Physician Assistant

## 2011-08-31 ENCOUNTER — Other Ambulatory Visit: Payer: Self-pay | Admitting: *Deleted

## 2011-08-31 ENCOUNTER — Encounter: Payer: Self-pay | Admitting: Physician Assistant

## 2011-08-31 VITALS — BP 131/66 | HR 94 | Temp 98.3°F | Wt 153.0 lb

## 2011-08-31 DIAGNOSIS — J40 Bronchitis, not specified as acute or chronic: Secondary | ICD-10-CM

## 2011-08-31 DIAGNOSIS — M549 Dorsalgia, unspecified: Secondary | ICD-10-CM

## 2011-08-31 LAB — POCT URINALYSIS DIPSTICK
Bilirubin, UA: NEGATIVE
Blood, UA: NEGATIVE
Glucose, UA: NEGATIVE
Ketones, UA: NEGATIVE
Leukocytes, UA: NEGATIVE
Nitrite, UA: NEGATIVE
Protein, UA: NEGATIVE
Spec Grav, UA: 1.015
Urobilinogen, UA: 0.2
pH, UA: 6

## 2011-08-31 MED ORDER — WELLBUTRIN XL 150 MG PO TB24: 150.0000 mg | ORAL_TABLET | Freq: Two times a day (BID) | ORAL | Status: DC

## 2011-08-31 MED ORDER — SYNTHROID 112 MCG PO TABS: 112.0000 ug | ORAL_TABLET | Freq: Every day | ORAL | Status: DC

## 2011-08-31 MED ORDER — AZITHROMYCIN 250 MG PO TABS: ORAL_TABLET | ORAL | Status: AC

## 2011-08-31 NOTE — Progress Notes (Signed)
  Subjective:    Patient ID: Stacey Contreras, female    DOB: 02-22-1956, 56 y.o.   MRN: [id number]  HPI Patient has not felt well since Christmas approximately 3 weeks. She has had a cough with no production, sneezing, sinus pressure, and general fatigue. Over the past week symptoms have gotten worse and she states "her lungs hurt". She reports that it hurts to take a deep breath, it hurts to move, and it even hurts to carry her bag. Everyone in her family has been sick and needed an antibiotic. She has ran fever off and on over the duration of illness but has not ran one in the last 3 days. She tried Aleve sinus and allergy but it has not touched the sinus pressure and chest tightness. She denies any SOB, wheezing, nausea, or vomiting. She has also had sore throat that is off and on. She had left over Flonase and she has been using that with no relief.    Review of Systems     Objective:   Physical Exam  Constitutional: She is oriented to person, place, and time. She appears well-developed and well-nourished.  HENT:  Head: Normocephalic and atraumatic.  Right Ear: External ear normal.  Left Ear: External ear normal.  Nose: Nose normal.  Mouth/Throat: Oropharynx is clear and moist. No oropharyngeal exudate.       No maxillary tenderness noted.   Eyes: Conjunctivae are normal. Right eye exhibits discharge. Left eye exhibits discharge.       Clear watery discharge bilaterally.  Neck: Normal range of motion. Neck supple.  Cardiovascular: Normal rate, regular rhythm and normal heart sounds.   Pulmonary/Chest: She has no wheezes. She exhibits tenderness.       Negative for CVA tenderness. Struggled to take a deep breath because of pain with deep breathing.   Lymphadenopathy:    She has no cervical adenopathy.  Neurological: She is alert and oriented to person, place, and time.  Skin: Skin is warm and dry.  Psychiatric: She has a normal mood and affect. Her behavior is normal.            Assessment & Plan:  Bronchitis- SPO2- 98% Zpak to start. Symptomatic care to continue. Call office if not better by Friday and might need to consider a prednisone pack.  Back pain- UA(normal) Advised patient this could be pain from costochondritis due to coughing.

## 2011-08-31 NOTE — Patient Instructions (Addendum)
Continue symptomatic relief. Start zpak. Call office if not better Friday we might need to consider prednisone pack.  Costochondritis Costochondritis (Tietze syndrome), or costochondral separation, is a swelling and irritation (inflammation) of the tissue (cartilage) that connects your ribs with your breastbone (sternum). It may occur on its own (spontaneously), through damage caused by an accident (trauma), or simply from coughing or minor exercise. It may take up to 6 weeks to get better and longer if you are unable to be conservative in your activities. HOME CARE INSTRUCTIONS   Avoid exhausting physical activity. Try not to strain your ribs during normal activity. This would include any activities using chest, belly (abdominal), and side muscles, especially if heavy weights are used.   Use ice for 15 to 20 minutes per hour while awake for the first 2 days. Place the ice in a plastic bag, and place a towel between the bag of ice and your skin.   Only take over-the-counter or prescription medicines for pain, discomfort, or fever as directed by your caregiver.  SEEK IMMEDIATE MEDICAL CARE IF:   Your pain increases or you are very uncomfortable.   You have a fever.   You develop difficulty with your breathing.   You cough up blood.   You develop worse chest pains, shortness of breath, sweating, or vomiting.   You develop new, unexplained problems (symptoms).  MAKE SURE YOU:   Understand these instructions.   Will watch your condition.   Will get help right away if you are not doing well or get worse.  Document Released: 05/12/2005 Document Revised: 04/14/2011 Document Reviewed: 03/20/2008 [facility] Patient Information 2012 [place], [place].

## 2011-10-18 ENCOUNTER — Other Ambulatory Visit: Payer: Self-pay | Admitting: Family Medicine

## 2011-10-18 ENCOUNTER — Encounter: Payer: Self-pay | Admitting: Family Medicine

## 2011-10-18 ENCOUNTER — Other Ambulatory Visit: Payer: Self-pay | Admitting: Obstetrics & Gynecology

## 2011-10-18 ENCOUNTER — Ambulatory Visit (INDEPENDENT_AMBULATORY_CARE_PROVIDER_SITE_OTHER): Payer: BC Managed Care – PPO | Admitting: Family Medicine

## 2011-10-18 ENCOUNTER — Ambulatory Visit
Admission: RE | Admit: 2011-10-18 | Discharge: 2011-10-18 | Disposition: A | Discharge status: Home / Self Care | Payer: BC Managed Care – PPO | Source: Ambulatory Visit | Attending: Family Medicine | Admitting: Family Medicine

## 2011-10-18 VITALS — BP 127/68 | HR 87 | Ht 64.0 in | Wt 155.0 lb

## 2011-10-18 DIAGNOSIS — M79674 Pain in right toe(s): Secondary | ICD-10-CM

## 2011-10-18 DIAGNOSIS — M79609 Pain in unspecified limb: Secondary | ICD-10-CM

## 2011-10-18 DIAGNOSIS — Z1231 Encounter for screening mammogram for malignant neoplasm of breast: Secondary | ICD-10-CM

## 2011-10-18 NOTE — Patient Instructions (Signed)
Please wear the postop shoe for 10-14 days. We will call the x-ray results come back. He can use Tylenol for pain relief. Ice 10-15 minutes 3 or 4 times a day as needed.

## 2011-10-18 NOTE — Progress Notes (Signed)
  Subjective:    Patient ID: Stacey Contreras, female    DOB: 08-Dec-1955, 56 y.o.   MRN: [id number]  HPI Stubbed 5th digit on wall molding 12 days ago. She has been icing it as well.  Toe is worse with activity.  No meds for pain relief. Has still stayed swollen. She says it is affectint her bowling game.     Review of Systems     Objective:   Physical Exam  fifth digit on theright foot is tender to palpation and swollen and mildly erythematous. There is no break in the skin or bruising. She is able to flex her toe.      Assessment & Plan:  Toe pain , 5th digit - Get xray to rule out fracture.  Given postop shoe. Even if there is moderate fracture because she is having such pain and soreness that she may benefit from wearing the postop shoe for 10-14 days. If there is a fracture we'll call her with the results. She may need to buddy tape the toes. I explained to do this just in case there is actually a fracture. She can certainly use Tylenol for pain relief if needed. Continue icing it when necessary.

## 2011-10-26 ENCOUNTER — Ambulatory Visit
Admission: RE | Admit: 2011-10-26 | Discharge: 2011-10-26 | Disposition: A | Discharge status: Home / Self Care | Payer: BC Managed Care – PPO | Source: Ambulatory Visit | Attending: Obstetrics & Gynecology | Admitting: Obstetrics & Gynecology

## 2011-10-26 DIAGNOSIS — Z1231 Encounter for screening mammogram for malignant neoplasm of breast: Secondary | ICD-10-CM

## 2011-11-26 ENCOUNTER — Other Ambulatory Visit: Payer: Self-pay | Admitting: *Deleted

## 2011-11-26 MED ORDER — WELLBUTRIN XL 150 MG PO TB24: 150.0000 mg | ORAL_TABLET | Freq: Two times a day (BID) | ORAL | Status: DC

## 2011-11-26 MED ORDER — SYNTHROID 112 MCG PO TABS: 112.0000 ug | ORAL_TABLET | Freq: Every day | ORAL | Status: DC

## 2011-12-20 ENCOUNTER — Ambulatory Visit (INDEPENDENT_AMBULATORY_CARE_PROVIDER_SITE_OTHER): Payer: BC Managed Care – PPO | Admitting: Family Medicine

## 2011-12-20 ENCOUNTER — Encounter: Payer: Self-pay | Admitting: Family Medicine

## 2011-12-20 VITALS — BP 108/70 | HR 104 | Temp 98.2°F | Ht 64.0 in | Wt 152.0 lb

## 2011-12-20 DIAGNOSIS — J329 Chronic sinusitis, unspecified: Secondary | ICD-10-CM

## 2011-12-20 DIAGNOSIS — Z23 Encounter for immunization: Secondary | ICD-10-CM

## 2011-12-20 MED ORDER — AMOXICILLIN-POT CLAVULANATE 875-125 MG PO TABS: 1.0000 | ORAL_TABLET | Freq: Two times a day (BID) | ORAL | Status: AC

## 2011-12-20 NOTE — Progress Notes (Signed)
  Subjective:    Patient ID: Stacey Contreras, female    DOB: 1956-03-26, 56 y.o.   MRN: [id number]  HPI Sinus congestoin for 10 days + productive cough with green phelgm.  Hearng is dec and pain in ears. Left lung hurts with cough. Tired from being sick.  Thyroid checked a month ago.  Painful with deep breath. Has tried dayquail or nyquail.  Left frontal sinus is painful.  No HA.     Review of Systems     Objective:   Physical Exam  Constitutional: She is oriented to person, place, and time. She appears well-developed and well-nourished.  HENT:  Head: Normocephalic and atraumatic.  Right Ear: External ear normal.  Left Ear: External ear normal.  Nose: Nose normal.  Mouth/Throat: Oropharynx is clear and moist.       TMs and canals are clear.   Eyes: Conjunctivae and EOM are normal. Pupils are equal, round, and reactive to light.  Neck: Neck supple. No thyromegaly present.  Cardiovascular: Normal rate, regular rhythm and normal heart sounds.   Pulmonary/Chest: Effort normal and breath sounds normal. She has no wheezes.  Lymphadenopathy:    She has no cervical adenopathy.  Neurological: She is alert and oriented to person, place, and time.  Skin: Skin is warm and dry.  Psychiatric: She has a normal mood and affect. Her behavior is normal.          Assessment & Plan:  Sinusitis - will tx with augmentin and call if not better in one week. H.O. Given.  Can take mucinex  If would like. Stay hydrated.

## 2011-12-20 NOTE — Patient Instructions (Signed)

## 2012-01-04 ENCOUNTER — Encounter: Payer: Self-pay | Admitting: Family Medicine

## 2012-01-05 ENCOUNTER — Ambulatory Visit (INDEPENDENT_AMBULATORY_CARE_PROVIDER_SITE_OTHER): Payer: BC Managed Care – PPO | Admitting: Obstetrics & Gynecology

## 2012-01-05 ENCOUNTER — Encounter: Payer: Self-pay | Admitting: Obstetrics & Gynecology

## 2012-01-05 VITALS — BP 127/72 | HR 90 | Temp 97.5°F | Resp 16 | Ht 64.0 in | Wt 152.0 lb

## 2012-01-05 DIAGNOSIS — Z0000 Encounter for general adult medical examination without abnormal findings: Secondary | ICD-10-CM

## 2012-01-05 DIAGNOSIS — Z124 Encounter for screening for malignant neoplasm of cervix: Secondary | ICD-10-CM

## 2012-01-05 NOTE — Progress Notes (Signed)
Subjective:    Stacey Contreras is a 56 y.o. female who presents for an annual exam. The patient has no complaints today. The patient is sexually active. (uses [employer]ro[employer]) GYN screening history: last pap: was normal. The patient wears seatbelts: yes. The patient participates in regular exercise: no. Has the patient ever been transfused or tattooed?: yes. (transfusion)The patient reports that there is not domestic violence in her life.   Menstrual History: OB History    Grav Para Term Preterm Abortions TAB SAB Ect Mult Living   6 1   5  3 2        Obstetric Comments   Has 2 adopted children      Menarche age: 80 Patient's last menstrual period was 08/16/1998.    The following portions of the patient's history were reviewed and updated as appropriate: allergies, current medications, past family history, past medical history, past social history, past surgical history and problem list. Mammogram normal 3/13  Review of Systems Pertinent items are noted in HPI. Married homemaker. 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   Objective:    BP 127/72  Pulse 90  Temp(Src) 97.5 F (36.4 C) (Oral)  Resp 16  Ht 5\' 4"  (1.626 m)  Wt 68.947 kg (152 lb)  BMI 26.09 kg/m2  LMP 08/16/1998  General Appearance:    Alert, cooperative, no distress, appears stated age  Head:    Normocephalic, without obvious abnormality, atraumatic  Eyes:    PERRL, conjunctiva/corneas clear, EOM's intact, fundi    benign, both eyes  Ears:    Normal TM's and external ear canals, both ears  Nose:   Nares normal, septum midline, mucosa normal, no drainage    or sinus tenderness  Throat:   Lips, mucosa, and tongue normal; teeth and gums normal  Neck:   Supple, symmetrical, trachea midline, no adenopathy;    thyroid:  no enlargement/tenderness/nodules; no carotid   bruit or JVD  Back:     Symmetric, no curvature, ROM normal, no CVA tenderness  Lungs:     Clear to auscultation bilaterally, respirations unlabored   Chest Wall:    No tenderness or deformity   Heart:    Regular rate and rhythm, S1 and S2 normal, no murmur, rub   or gallop  Breast Exam:    No tenderness, masses, or nipple abnormality  Abdomen:     Soft, non-tender, bowel sounds active all four quadrants,    no masses, no organomegaly  Genitalia:    Normal female without lesion, discharge or tenderness, atrophy of vulva/vagina, no masses on bimanual     Extremities:   Extremities normal, atraumatic, no cyanosis or edema  Pulses:   2+ and symmetric all extremities  Skin:   Skin color, texture, turgor normal, no rashes or lesions  Lymph nodes:   Cervical, supraclavicular, and axillary nodes normal  Neurologic:   CNII-XII intact, normal strength, sensation and reflexes    throughout  .    Assessment:    Healthy female exam.    Plan:     Pap smear.

## 2012-02-08 ENCOUNTER — Ambulatory Visit (INDEPENDENT_AMBULATORY_CARE_PROVIDER_SITE_OTHER): Payer: BC Managed Care – PPO | Admitting: Family Medicine

## 2012-02-08 ENCOUNTER — Encounter: Payer: Self-pay | Admitting: Family Medicine

## 2012-02-08 VITALS — BP 124/72 | HR 88 | Ht 64.0 in | Wt 152.0 lb

## 2012-02-08 DIAGNOSIS — L259 Unspecified contact dermatitis, unspecified cause: Secondary | ICD-10-CM

## 2012-02-08 MED ORDER — PREDNISONE (PAK) 10 MG PO TABS: 10.0000 mg | ORAL_TABLET | Freq: Every day | ORAL | Status: AC

## 2012-02-08 NOTE — Progress Notes (Signed)
Subjective:    Patient ID: Stacey Contreras, female    DOB: March 13, 1956, 56 y.o.   MRN: [id number]  Rash This is a new problem. The current episode started more than 1 month ago. The problem has been waxing and waning since onset. The affected locations include the left lower leg, right lower leg, right arm and left arm. The rash is characterized by itchiness, burning and redness. She was exposed to nothing. Associated symptoms include diarrhea, fatigue and joint pain. Pertinent negatives include no anorexia, congestion, cough, eye pain, facial edema, fever, nail changes, rhinorrhea, shortness of breath, sore throat or vomiting. (Headache, interfering w/sleep) Past treatments include anti-itch cream. The treatment provided no relief. Her past medical history is significant for eczema and varicella. There is no history of allergies or asthma.      Review of Systems  Constitutional: Positive for fatigue. Negative for fever.  HENT: Negative for congestion, sore throat and rhinorrhea.   Eyes: Negative for pain.  Respiratory: Negative for cough and shortness of breath.   Gastrointestinal: Positive for diarrhea. Negative for vomiting and anorexia.  Musculoskeletal: Positive for joint pain.  Skin: Positive for rash. Negative for nail changes.  All other systems reviewed and are negative.      BP 124/72  Pulse 88  Ht 5\' 4"  (1.626 m)  Wt 152 lb (68.947 kg)  BMI 26.09 kg/m2  SpO2 99%  LMP 08/16/1998 Objective:   Physical Exam  Constitutional: She is oriented to person, place, and time. She appears well-developed and well-nourished.  Eyes: Pupils are equal, round, and reactive to light.  Musculoskeletal: She exhibits no edema and no tenderness.  Neurological: She is alert and oriented to person, place, and time.  Skin: Skin is warm and dry. Rash noted. There is erythema.       The rash on the left upper leg appears to be more of a contact dermatitis. This isolated lesions on her right leg and on  both forearms as well that she has been scratching it looks more like a pediculosis infection.  Psychiatric: She has a normal mood and affect. Her behavior is normal.      Assessment & Plan:  Rash/contact dermatitis/questionable pediculosis infection. Interesting when I explained patient my first inclination and gut feeling that this was a contact dermatitis because of the rash on her right leg she has informed me that she really didn't see how, she really want to take prednisone and questions if there is any way we can want to test to see. I explained my second concern over the rash on the right leg and on the arms and this really concerned the patient. Tried to explain to her I cannot say how this rash could occur but she did not want me to treat her with steroids for possible contact dermatitis we could treat her for pediculosis. That also did not go well nor did the option for a dermatologist referral. Several times explained that there is no way at this facility to really test my hypophysis. After the question of pediculosis was raised patient does remember that she has been outside doing a lot of reading states had new growth outside in the yard with all the heavy rain and this is only the second year they have been at that house. She is willing to try the prednisone for contact dermatitis. I stressed to her the need to take it for full 12 days and I strongly recommend an antihistamine such as Claritin or Allegra. As  per my notes shows more counseling was done and initially expected.  Patient was asked return if not improved in 2 weeks.

## 2012-02-08 NOTE — Patient Instructions (Addendum)

## 2012-02-22 ENCOUNTER — Other Ambulatory Visit: Payer: Self-pay | Admitting: *Deleted

## 2012-02-22 MED ORDER — BUPROPION HCL ER (XL) 150 MG PO TB24: 300.0000 mg | ORAL_TABLET | Freq: Every day | ORAL | Status: DC

## 2012-02-22 MED ORDER — SYNTHROID 112 MCG PO TABS: 112.0000 ug | ORAL_TABLET | Freq: Every day | ORAL | Status: DC

## 2012-02-28 ENCOUNTER — Other Ambulatory Visit: Payer: Self-pay | Admitting: *Deleted

## 2012-02-28 ENCOUNTER — Telehealth: Payer: Self-pay | Admitting: Family Medicine

## 2012-02-28 MED ORDER — WELLBUTRIN XL 150 MG PO TB24: 300.0000 mg | ORAL_TABLET | Freq: Every day | ORAL | Status: DC

## 2012-02-28 NOTE — Telephone Encounter (Signed)
Patient called upset advised that her medicine Welbutrin was called in but (Brand) was not checked when ordered and she can ot take Welbutrin if it does not have Brand checked. Pt states that she paid for this medicine and this is the 2nd time her medicine was ordered and it was wrong. Pt request to have a call back [phone]

## 2012-02-28 NOTE — Telephone Encounter (Signed)
Sent correct rx with DAW.  Not sure if getting request from pharmacy for generic and it is being filled. Not sure. Let speak with Stacey Contreras about this.  Maybe we can find out how much she paid for it.

## 2012-02-29 NOTE — Telephone Encounter (Signed)
Called insurance company to find out how much patient paid for the med and it was 20.00. Spoke with Toniann Fail about this and agreed to offer the patient a gift card for the cost of the medication that was sent wrong. Called patient and informed her of this and she states that she spoke with the insurance company last night and they agreed to send her the [facility] name Wellbutrin and not charge her for the generic that was sent. Informed pt if needed anything else from [place] to let us know. Informed Toniann Fail no action needs to be taken at this time for reimbursement of the cost of the med. KG LPN

## 2012-02-29 NOTE — Telephone Encounter (Signed)
Ok, sounds great. Thank you.

## 2012-05-19 ENCOUNTER — Other Ambulatory Visit: Payer: Self-pay | Admitting: *Deleted

## 2012-05-19 MED ORDER — SYNTHROID 112 MCG PO TABS: 112.0000 ug | ORAL_TABLET | Freq: Every day | ORAL | Status: DC

## 2012-07-03 ENCOUNTER — Ambulatory Visit (INDEPENDENT_AMBULATORY_CARE_PROVIDER_SITE_OTHER): Payer: BC Managed Care – PPO

## 2012-07-03 ENCOUNTER — Encounter: Payer: Self-pay | Admitting: Family Medicine

## 2012-07-03 ENCOUNTER — Ambulatory Visit (INDEPENDENT_AMBULATORY_CARE_PROVIDER_SITE_OTHER): Payer: BC Managed Care – PPO | Admitting: Family Medicine

## 2012-07-03 VITALS — BP 132/73 | HR 87 | Ht 64.0 in | Wt 148.0 lb

## 2012-07-03 DIAGNOSIS — M79671 Pain in right foot: Secondary | ICD-10-CM

## 2012-07-03 DIAGNOSIS — M79646 Pain in unspecified finger(s): Secondary | ICD-10-CM

## 2012-07-03 DIAGNOSIS — M7050 Other bursitis of knee, unspecified knee: Secondary | ICD-10-CM

## 2012-07-03 DIAGNOSIS — M79609 Pain in unspecified limb: Secondary | ICD-10-CM

## 2012-07-03 DIAGNOSIS — IMO0002 Reserved for concepts with insufficient information to code with codable children: Secondary | ICD-10-CM

## 2012-07-03 DIAGNOSIS — M79672 Pain in left foot: Secondary | ICD-10-CM

## 2012-07-03 NOTE — Progress Notes (Signed)
  Subjective:    Patient ID: Stacey Contreras, female    DOB: January 17, 1956, 56 y.o.   MRN: [id number]  HPI Left Inner knee pain. Started getting sharp pain on the left inner knee. Acutally woke up with sharp excrutiating pain. Says her knee has popped for years.  Says it did swell so she put ice on it and took Aleve - says this really helped.  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 She has quit that job now. No known injury or trauma but she did play volleyball in high school and thinks may have caused some injuries then.  Having pain in the thumb left. Has been there for years but getting worse.  Getting weak now.  Hard to go bowling.  Did have x-rays of her hands than in 2011.  Bilateral foot pain.  Say wants to starte walking again.  Has old fracture in the  little toe on the right foot.  No other trauma or injuries. Her right that's bothering her a little bit more than the left. Pain across distal MTPs in both feet. Not sure what to wear as far as shoes are concerned. Says not actual swelling in her feet.  Says the feet feel very stiff esp getting out of the car.    Review of Systems     Objective:   Physical Exam  Musculoskeletal:       Left knee with normal range of motion. No increased laxity. Knee, ankle strength is 5 out of 5 on the left. She is nontender around the patella or along the joint lines. She is very tender over the past and strain bursa. The feet appear to be normal. She does have small arches. She's nontender over the distal metatarsals which is where she complains of her pain. No sign of neuroma. No significant swelling. Left thumb with normal extension. Decreased flexion. In fact she has difficulty actually touching her PT with her thumb. Her strength is intact.          Assessment & Plan:  Has anserine bursitis, right knee-discussed etiology. Gave her handout and additional information.  Discussed icing it, wrapping it and resting it. She can start the exercises after a few days. If not improving then please let us know.  Bilateral foot pain-I. would like to see Dr. Benjamin Stain for further evaluation. Do not suspect fractures because it all across the top of her foot, thus I did not get x-rays. I suspect she may just need some additional support. Metatarsal cookies my even help. She might also benefit from custom orthotics.  Left thumb pain-will go ahead and get x-rays today. I think she does most likely have some arthritis in the thumb but also consider tendinitis. There she's starting to get some of the weakness in the thumb. On exam her strength is great today.

## 2012-07-03 NOTE — Patient Instructions (Addendum)
Please make an appt with Dr. Benjamin Stain as you leave today for your feet and thumb.

## 2012-07-04 ENCOUNTER — Encounter: Payer: Self-pay | Admitting: Sports Medicine

## 2012-07-04 ENCOUNTER — Ambulatory Visit (INDEPENDENT_AMBULATORY_CARE_PROVIDER_SITE_OTHER): Payer: BC Managed Care – PPO | Admitting: Sports Medicine

## 2012-07-04 VITALS — BP 127/68 | HR 84 | Wt 148.0 lb

## 2012-07-04 DIAGNOSIS — M79645 Pain in left finger(s): Secondary | ICD-10-CM

## 2012-07-04 DIAGNOSIS — M189 Osteoarthritis of first carpometacarpal joint, unspecified: Secondary | ICD-10-CM | POA: Insufficient documentation

## 2012-07-04 DIAGNOSIS — R269 Unspecified abnormalities of gait and mobility: Secondary | ICD-10-CM | POA: Insufficient documentation

## 2012-07-04 DIAGNOSIS — M79609 Pain in unspecified limb: Secondary | ICD-10-CM

## 2012-07-04 DIAGNOSIS — M79673 Pain in unspecified foot: Secondary | ICD-10-CM

## 2012-07-04 MED ORDER — MELOXICAM 15 MG PO TABS: ORAL_TABLET | ORAL | Status: DC

## 2012-07-04 NOTE — Progress Notes (Signed)
SPORTS MEDICINE CONSULTATION REPORT    Subjective:     Patient ID: Stacey Contreras, female   DOB: [birth year], 56 y.o.   MRN: [id number]  I'm seeing this patient as a consultation for:  Dr. Linford Arnold  HPI Patient is a 56 yo female woman who is presenting with a 6 month history of bilateral pain in her feet and a 3 year history of bilateral pain at the base of her thumbs.   Feet: Patient explains the pain is mainly on the top of her feet around the area of the 1st-5th metatarsal phalengeal joints for the last 6 months. The pain is worse with weight bearing and driving (pressing the gas pedal). She has found nothing to help the pain. She has tried heel lifts, but feels that these have worsened the pain. She's not tried any NSAIDs. The pain is localized, does not radiate, is moderate.  Thumbs: Patient complains of bilateral pain at the area of the 1st metacarpal phalangeal joints which is worse on the left. This pain has been going on for around 3 years since moving to [facility] from [place]. Patient believes the pain is progressing to a point where she can no longer stand it. She has found nothing to improve the pain. She does frequently wake up with numbness and tingling in both of her hands and describes the distribution as the entire hand. Her pain is overall localized at the above joint, does not radiate, she's not tried any oral medicines or NSAIDs for this.  Past medical history, Surgical history, Family history, Social history, Allergies, and medications have been entered into the medical record, reviewed, and no changes needed.   Review of Systems  Musculoskeletal: Positive for arthralgias. Negative for joint swelling.  All other systems reviewed and are negative.  No headache, visual changes, nausea, vomiting, diarrhea, constipation, dizziness, abdominal pain, skin rash, fevers, chills, night sweats, weight loss, swollen lymph nodes, body aches, joint swelling, muscle aches, chest pain, or shortness  of breath.     Objective:   Physical Exam  Constitutional: She is oriented to person, place, and time. She appears well-developed and well-nourished.  Musculoskeletal: Normal range of motion.       Pain on dorsal aspect of 1st-5th metatarsal phalangeal joints bilaterally though worse on right than left.   Pain to palpation at 1st metacarpal phalangeal joints bilaterally.   Neurological: She is alert and oriented to person, place, and time.  Skin: Skin is warm and dry.  Vitals:  Afebrile, vital signs stable. General: Well Developed, well nourished, and in no acute distress.  Neuro/Psych: Alert and oriented x3, extra-ocular muscles intact, able to move all 4 extremities.  Skin: Warm and dry, no rashes noted.  Respiratory: Not using accessory muscles, speaking in full sentences, trachea midline.  Cardiovascular: Pulses palpable, no extremity edema. Abdomen: Does not appear distended. Wrist: Negative Finkelstein sign in hands. Tender to palpation at carpometacarpal joint of thumb.  Feet: She has pes cavus bilaterally, there is no pain on the plantar aspect of the metatarsal heads. She does have significant reproduction of her pain dorsally at the metatarsophalangeal joints. There is no pain with resisted dorsi or plantar flexion of the great toe, or the small toes.  I placed small metatarsal pads in both shoes, and she noted her pain resolved nearly completely.      Impression/Recommendations :     This case required medical decision making of moderate complexity.

## 2012-07-04 NOTE — Assessment & Plan Note (Addendum)
Her pain is not at the right location to represent true metatarsalgia, however she does have pain as well as breakdown of the transverse arch. I think this is related to recurrent hyperdorsiflexion of her metatarsophalangeal joints. We will place metatarsal pads, and she will come back for custom orthotics. The pads improved her pain greatly.

## 2012-07-04 NOTE — Assessment & Plan Note (Signed)
She localizes pain over the first carpometacarpal joint. In the absence of degenerative change, this may represent synovitis. Mobic daily for 2 weeks then as needed. If no better when she comes back, we will inject.

## 2012-07-10 ENCOUNTER — Other Ambulatory Visit: Payer: Self-pay | Admitting: *Deleted

## 2012-07-10 DIAGNOSIS — M79645 Pain in left finger(s): Secondary | ICD-10-CM

## 2012-07-10 MED ORDER — MELOXICAM 15 MG PO TABS: ORAL_TABLET | ORAL | Status: DC

## 2012-07-18 ENCOUNTER — Ambulatory Visit (INDEPENDENT_AMBULATORY_CARE_PROVIDER_SITE_OTHER): Payer: BC Managed Care – PPO | Admitting: Sports Medicine

## 2012-07-18 ENCOUNTER — Encounter: Payer: Self-pay | Admitting: Sports Medicine

## 2012-07-18 VITALS — BP 131/67 | HR 82 | Wt 150.0 lb

## 2012-07-18 DIAGNOSIS — M79609 Pain in unspecified limb: Secondary | ICD-10-CM

## 2012-07-18 DIAGNOSIS — R269 Unspecified abnormalities of gait and mobility: Secondary | ICD-10-CM

## 2012-07-18 DIAGNOSIS — M79645 Pain in left finger(s): Secondary | ICD-10-CM

## 2012-07-18 NOTE — Progress Notes (Signed)
Patient was fitted for a : standard, cushioned, semi-rigid orthotic. The orthotic was heated and afterward the patient stood on the orthotic blank positioned on the orthotic stand. The patient was positioned in subtalar neutral position and 10 degrees of ankle dorsiflexion in a weight bearing stance. After completion of molding, a stable base was applied to the orthotic blank. The blank was ground to a stable position for weight bearing. Size: 7 Base: Blue EVA Additional Posting and Padding: Small metatarsal pads placed The patient ambulated these, and they were very comfortable.

## 2012-07-18 NOTE — Assessment & Plan Note (Signed)
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 She has not yet started the Mobic, she will do this, and come back to see me in 2 weeks. If no better we can inject under guidance

## 2012-07-18 NOTE — Assessment & Plan Note (Addendum)
Stacey Contreras did extremely well with metatarsal pads, we built her custom orthotics today. She will also do patellofemoral rehabilitation exercises. I would like to see her back in 4 weeks for this.

## 2012-08-01 ENCOUNTER — Ambulatory Visit: Payer: BC Managed Care – PPO | Admitting: Sports Medicine

## 2012-08-02 ENCOUNTER — Encounter: Payer: Self-pay | Admitting: Sports Medicine

## 2012-08-02 ENCOUNTER — Ambulatory Visit (INDEPENDENT_AMBULATORY_CARE_PROVIDER_SITE_OTHER): Payer: BC Managed Care – PPO | Admitting: Sports Medicine

## 2012-08-02 VITALS — BP 130/71 | HR 99 | Wt 151.0 lb

## 2012-08-02 DIAGNOSIS — M189 Osteoarthritis of first carpometacarpal joint, unspecified: Secondary | ICD-10-CM

## 2012-08-02 DIAGNOSIS — R269 Unspecified abnormalities of gait and mobility: Secondary | ICD-10-CM

## 2012-08-02 DIAGNOSIS — M19049 Primary osteoarthritis, unspecified hand: Secondary | ICD-10-CM

## 2012-08-02 NOTE — Assessment & Plan Note (Signed)
This is very mild radiographically. Symptoms are very well controlled with meloxicam. She will continue this, she is interested in interventional management, but would like to put this on her flex spending account in January. She'll make an appointment for January for consideration of injection.

## 2012-08-02 NOTE — Assessment & Plan Note (Signed)
Pain continues to be 100% resolved custom orthotics. She is going to bring her husband, and father in for custom orthotics as well.

## 2012-08-02 NOTE — Progress Notes (Signed)
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 She is interested in injection, but is unsure whether she'll have to pay for it, and is desired weight until January in place this on her flex spending account.  Bilateral foot pain: She did extremely well with metatarsal pads, I made her custom orthotics at the last visit, and she continues to be 100% pain free.  Past medical history, Surgical history, Family history, Social history, Allergies, and medications have been entered into the medical record, reviewed, and no changes needed.   Review of Systems: No headache, visual changes, nausea, vomiting, diarrhea, constipation, dizziness, abdominal pain, skin rash, fevers, chills, night sweats, weight loss, swollen lymph nodes, body aches, joint swelling, muscle aches, chest pain, shortness of breath, mood changes, visual or auditory hallucinations.   Objective:   Vitals:  Afebrile, vital signs stable. General: Well Developed, well nourished, and in no acute distress.  Neuro/Psych: Alert and oriented x3, extra-ocular muscles intact, able to move all 4 extremities.  Skin: Warm and dry, no rashes noted.  Respiratory: Not using accessory muscles, speaking in full sentences, trachea midline.  Cardiovascular: Pulses palpable, no extremity edema. Abdomen: Does not appear distended.  Impression and Recommendations:   This case required medical decision making of moderate complexity.

## 2012-08-21 ENCOUNTER — Ambulatory Visit (INDEPENDENT_AMBULATORY_CARE_PROVIDER_SITE_OTHER): Payer: BC Managed Care – PPO

## 2012-08-21 ENCOUNTER — Ambulatory Visit (INDEPENDENT_AMBULATORY_CARE_PROVIDER_SITE_OTHER): Payer: BC Managed Care – PPO | Admitting: Sports Medicine

## 2012-08-21 ENCOUNTER — Encounter: Payer: Self-pay | Admitting: Sports Medicine

## 2012-08-21 VITALS — BP 125/64 | HR 94 | Ht 64.0 in | Wt 152.0 lb

## 2012-08-21 DIAGNOSIS — R200 Anesthesia of skin: Secondary | ICD-10-CM

## 2012-08-21 DIAGNOSIS — M5030 Other cervical disc degeneration, unspecified cervical region: Secondary | ICD-10-CM

## 2012-08-21 DIAGNOSIS — M189 Osteoarthritis of first carpometacarpal joint, unspecified: Secondary | ICD-10-CM

## 2012-08-21 DIAGNOSIS — M19049 Primary osteoarthritis, unspecified hand: Secondary | ICD-10-CM

## 2012-08-21 DIAGNOSIS — R209 Unspecified disturbances of skin sensation: Secondary | ICD-10-CM

## 2012-08-21 DIAGNOSIS — M79609 Pain in unspecified limb: Secondary | ICD-10-CM

## 2012-08-21 DIAGNOSIS — M79645 Pain in left finger(s): Secondary | ICD-10-CM

## 2012-08-21 MED ORDER — GABAPENTIN 300 MG PO CAPS: ORAL_CAPSULE | ORAL | Status: DC

## 2012-08-21 MED ORDER — MELOXICAM 15 MG PO TABS: ORAL_TABLET | ORAL | Status: DC

## 2012-08-21 MED ORDER — PREDNISONE 50 MG PO TABS: ORAL_TABLET | ORAL | Status: DC

## 2012-08-21 NOTE — Addendum Note (Signed)
Addended by: Monica Becton on: 08/21/2012 02:56 PM   Modules accepted: Orders

## 2012-08-21 NOTE — Assessment & Plan Note (Signed)
Ultrasound guided injection as above. Continue Mobic as needed. Return to clinic as needed for this.

## 2012-08-21 NOTE — Assessment & Plan Note (Addendum)
With numbness in both arms in a nonspecific distribution, this is most likely related to cervical degenerative disc disease with possible cervical spinal stenosis. We will start conservatively with x-rays. No signs of cord compression are present.  X-rays do show multilevel cervical degenerative disc disease with bridging osteophytes. We'll start conservatively with prednisone and gabapentin, and rehabilitation. She will see me back after 3-4 weeks of this and if no better we can consider MRI for interventional injection planning.

## 2012-08-21 NOTE — Progress Notes (Signed)
SPORTS MEDICINE CONSULTATION REPORT  Subjective:    CC: Followup  HPI: Left thumb carpometacarpal DJD: Moderately well controlled with Mobic, however patient desires injection therapy.  Pain is localized at the left first carpal metacarpal joint, worse with any kind of activity, and she does tend to drop things. She does not have numbness in the median nerve distribution.  Bilateral arm numbness: She does endorse this predominantly at night, present on both sides all the way from the shoulder down to the hands. No trauma. Has never had this evaluated before. Symptoms are moderate, have been present for decades.  Past medical history, Surgical history, Family history, Social history, Allergies, and medications have been entered into the medical record, reviewed, and no changes needed.   Review of Systems: No headache, visual changes, nausea, vomiting, diarrhea, constipation, dizziness, abdominal pain, skin rash, fevers, chills, night sweats, weight loss, swollen lymph nodes, body aches, joint swelling, muscle aches, chest pain, shortness of breath, mood changes, visual or auditory hallucinations.   Objective:   Vitals:  Afebrile, vital signs stable. General: Well Developed, well nourished, and in no acute distress.  Neuro/Psych: Alert and oriented x3, extra-ocular muscles intact, able to move all 4 extremities.  Skin: Warm and dry, no rashes noted.  Respiratory: Not using accessory muscles, speaking in full sentences, trachea midline.  Cardiovascular: Pulses palpable, no extremity edema. Abdomen: Does not appear distended. Neck: Inspection unremarkable. No palpable stepoffs. Negative Spurling's maneuver. Full neck range of motion Grip strength and sensation normal in bilateral hands Strength good C4 to T1 distribution No sensory change to C4 to T1 Negative Hoffman sign bilaterally Reflexes normal  Procedure: Real-time Ultrasound Guided Injection of left first carpometacarpal  joint Device: GE Logiq E  Ultrasound guided injection is preferred based studies that show increased duration, increased effect, greater accuracy, decreased procedural pain, increased response rate, and decreased cost with ultrasound guided versus blind injection.  Verbal informed consent obtained.  Time-out conducted.  Noted no overlying erythema, induration, or other signs of local infection.  Skin prepped in a sterile fashion.  Local anesthesia: Topical Ethyl chloride.  With sterile technique and under real time ultrasound guidance:  0.5 cc Kenalog 40, 1 cc lidocaine injected easily into the carpometacarpal joint. Completed without difficulty  Pain immediately resolved suggesting accurate placement of the medication.  Advised to call if fevers/chills, erythema, induration, drainage, or persistent bleeding.  Images permanently stored and available for review in the ultrasound unit.  Impression: Technically successful ultrasound guided injection.  Impression and Recommendations:   This case required medical decision making of moderate complexity.

## 2012-08-29 ENCOUNTER — Other Ambulatory Visit: Payer: Self-pay | Admitting: *Deleted

## 2012-08-29 MED ORDER — SYNTHROID 112 MCG PO TABS: 112.0000 ug | ORAL_TABLET | Freq: Every day | ORAL | Status: DC

## 2012-09-05 ENCOUNTER — Ambulatory Visit (INDEPENDENT_AMBULATORY_CARE_PROVIDER_SITE_OTHER): Payer: BC Managed Care – PPO | Admitting: Family Medicine

## 2012-09-05 ENCOUNTER — Encounter: Payer: Self-pay | Admitting: Family Medicine

## 2012-09-05 VITALS — BP 131/74 | HR 84 | Temp 99.0°F | Wt 150.0 lb

## 2012-09-05 DIAGNOSIS — A499 Bacterial infection, unspecified: Secondary | ICD-10-CM

## 2012-09-05 DIAGNOSIS — J329 Chronic sinusitis, unspecified: Secondary | ICD-10-CM

## 2012-09-05 DIAGNOSIS — B9689 Other specified bacterial agents as the cause of diseases classified elsewhere: Secondary | ICD-10-CM

## 2012-09-05 MED ORDER — AMOXICILLIN-POT CLAVULANATE 500-125 MG PO TABS: ORAL_TABLET | ORAL | Status: AC

## 2012-09-05 NOTE — Progress Notes (Signed)
CC: Stacey Contreras is a 57 y.o. female is here for Otalgia   Subjective: HPI:  Patient presents to 2 bilateral ear pain, frontal head pressure, and chest congestion for the past 2 weeks. Symptoms have gotten no better nor worse since onset. She has had some mild rib tenderness on the right side it is worse with deep breaths. She describes ear pain is mild to moderate, nothing makes better or worse, present all hours of the day. Frontal head pressure is mild to moderate, worse when sitting up, better lying down, nothing else makes better or worse. Chest congestion is mild without a cough nor shortness of breath, nothing seems to make better or worse. Overall no interventions as of yet. Rib discomfort is mild and nonradiating without recent trauma or overexertion. Has had some mild fever. Denies fevers, chills, nausea, vomiting, motor or sensory disturbances, hearing loss, dizziness, chest pain, shortness of breath, orthopnea, myalgias.    Review Of Systems Outlined In HPI  Past Medical History  Diagnosis Date  . Hypothyroidism   . Depression   . Elevated lipids      Family History  Problem Relation Age of Onset  . Depression Father     attempted suicide  . Depression Sister     suidcide  . Hypertension Father   . Mitral valve prolapse Mother   . Hypothyroidism Mother      History  Substance Use Topics  . Smoking status: Never Smoker   . Smokeless tobacco: Never Used  . Alcohol Use: Yes     Comment: very rarely     Objective: Filed Vitals:   09/05/12 1454  BP: 131/74  Pulse: 84  Temp: 99 F (37.2 C)    General: Alert and Oriented, No Acute Distress HEENT: Pupils equal, round, reactive to light. Conjunctivae clear.  External ears unremarkable, canals clear with intact TMs with appropriate landmarks.  Middle ear appears open without effusion. Pink inferior turbinates.  Moist mucous membranes, pharynx without inflammation nor lesions.  Neck supple without palpable  lymphadenopathy nor abnormal masses. Frontal sinus tenderness to percussion Lungs: Clear to auscultation bilaterally, no wheezing/ronchi/rales.  Comfortable work of breathing. Good air movement. Chest: Palpation of intercostal muscles between the posterior ribs T6-T7 on the right reproduces her discomfort with no overlying skin changes Cardiac: Regular rate and rhythm. Normal S1/S2.  No murmurs, rubs, nor gallops.   Extremities: No peripheral edema.  Strong peripheral pulses.  Mental Status: No depression, anxiety, nor agitation. Skin: Warm and dry.  Assessment & Plan: Linnie was seen today for otalgia.  Diagnoses and associated orders for this visit:  Bacterial sinusitis - amoxicillin-clavulanate (AUGMENTIN) 500-125 MG per tablet; Take one by mouth every 8 hours for ten total days.    Bacterial sinusitis: Given duration of sinus symptoms we'll treat with Augmentin. Encouraged ibuprofen for discomfort, Mucinex products as well. Discussed at her rib pain is likely do to muscular strain and should self resolve,Signs and symptoms requring emergent/urgent reevaluation were discussed with the patient.  Return if symptoms worsen or fail to improve.

## 2012-09-08 ENCOUNTER — Ambulatory Visit: Payer: BC Managed Care – PPO | Admitting: Family Medicine

## 2012-11-13 ENCOUNTER — Encounter: Payer: Self-pay | Admitting: Family Medicine

## 2012-11-13 ENCOUNTER — Ambulatory Visit (INDEPENDENT_AMBULATORY_CARE_PROVIDER_SITE_OTHER): Payer: BC Managed Care – PPO | Admitting: Family Medicine

## 2012-11-13 VITALS — BP 137/75 | HR 110 | Temp 99.3°F | Wt 151.0 lb

## 2012-11-13 DIAGNOSIS — E039 Hypothyroidism, unspecified: Secondary | ICD-10-CM

## 2012-11-13 DIAGNOSIS — R197 Diarrhea, unspecified: Secondary | ICD-10-CM

## 2012-11-13 DIAGNOSIS — J029 Acute pharyngitis, unspecified: Secondary | ICD-10-CM

## 2012-11-13 LAB — POCT RAPID STREP A (OFFICE): Rapid Strep A Screen: NEGATIVE

## 2012-11-13 NOTE — Progress Notes (Signed)
  Subjective:    Patient ID: Stacey Contreras, female    DOB: 11-13-1955, 57 y.o.   MRN: [id number]  HPI Diarrhea x 5 days. No vomiting. Some nausea.  ST started on the left and now on both side.  ST is actually getting better.  Bilateral ear pain and feels like water in her ear.  Left side of neck is sore to touch. No fever. Maybe some chills.  No cough or sinus congestion.  Feeling tired. NO rash.  Husband has had diarrhea as well.  No blood in the stool.  Tried some allergy medication but no relief.  No recent travel. She's not been camping. No known exposures to contaminated foods or food poisoning.   Review of Systems     Objective:   Physical Exam  Constitutional: She is oriented to person, place, and time. She appears well-developed and well-nourished.  HENT:  Head: Normocephalic and atraumatic.  Right Ear: External ear normal.  Left Ear: External ear normal.  Nose: Nose normal.  Mouth/Throat: Oropharynx is clear and moist.  TMs and canals are clear.   Eyes: Conjunctivae and EOM are normal. Pupils are equal, round, and reactive to light.  Neck: Neck supple. No thyromegaly present.  Cardiovascular: Normal rate, regular rhythm and normal heart sounds.   Pulmonary/Chest: Effort normal and breath sounds normal. She has no wheezes.  Lymphadenopathy:    She has no cervical adenopathy.  Neurological: She is alert and oriented to person, place, and time.  Skin: Skin is warm and dry.  Psychiatric: She has a normal mood and affect.          Assessment & Plan:  Pharyngitis with diarrhea-most likely a viral syndrome. This report is actually a little bit better today but we will go ahead and swab for strep throat. The diarrhea has been persistent for 5 days. Make sure staying well hydrated. Call back if she notices any blood in the stool or if the diarrhea last more than a week and we'll consider further evaluation with stool cultures. No recent travel camping et cetera. She does have  hypothyroidism. We could consider rechecking her thyroid if not improving as well. Strep is neg today.

## 2012-11-14 LAB — CBC WITH DIFFERENTIAL/PLATELET
Basophils Absolute: 0 10*3/uL (ref 0.0–0.1)
Basophils Relative: 0 % (ref 0–1)
Eosinophils Absolute: 0.1 10*3/uL (ref 0.0–0.7)
Eosinophils Relative: 1 % (ref 0–5)
HCT: 42.3 % (ref 36.0–46.0)
Hemoglobin: 14.8 g/dL (ref 12.0–15.0)
Lymphocytes Relative: 21 % (ref 12–46)
Lymphs Abs: 1.6 10*3/uL (ref 0.7–4.0)
MCH: 32.7 pg (ref 26.0–34.0)
MCHC: 35 g/dL (ref 30.0–36.0)
MCV: 93.4 fL (ref 78.0–100.0)
Monocytes Absolute: 0.9 10*3/uL (ref 0.1–1.0)
Monocytes Relative: 12 % (ref 3–12)
Neutro Abs: 4.9 10*3/uL (ref 1.7–7.7)
Neutrophils Relative %: 66 % (ref 43–77)
Platelets: 345 10*3/uL (ref 150–400)
RBC: 4.53 MIL/uL (ref 3.87–5.11)
RDW: 13.4 % (ref 11.5–15.5)
WBC: 7.4 10*3/uL (ref 4.0–10.5)

## 2012-11-14 LAB — TSH: TSH: 0.51 u[IU]/mL (ref 0.350–4.500)

## 2012-11-20 ENCOUNTER — Telehealth: Payer: Self-pay | Admitting: *Deleted

## 2012-11-20 NOTE — Telephone Encounter (Signed)
See if she can come in this afternoon to have the ear rechecked to make sure that she's not developing an otitis media. If it's normal then we could consider a short course of steroids for inflammation and see if this provides pain relief.

## 2012-11-20 NOTE — Telephone Encounter (Signed)
Patient calls today and c/o ears still hurting and left side throat hurting and swollen. Pain is constant. Please advise

## 2012-11-20 NOTE — Telephone Encounter (Signed)
Pt notified and will schedule appt. Barry Dienes, LPN

## 2012-11-21 ENCOUNTER — Ambulatory Visit (INDEPENDENT_AMBULATORY_CARE_PROVIDER_SITE_OTHER): Payer: BC Managed Care – PPO | Admitting: Family Medicine

## 2012-11-21 ENCOUNTER — Encounter: Payer: Self-pay | Admitting: Family Medicine

## 2012-11-21 VITALS — BP 130/62 | HR 89 | Temp 98.9°F | Wt 151.0 lb

## 2012-11-21 DIAGNOSIS — J029 Acute pharyngitis, unspecified: Secondary | ICD-10-CM

## 2012-11-21 DIAGNOSIS — R05 Cough: Secondary | ICD-10-CM

## 2012-11-21 DIAGNOSIS — J309 Allergic rhinitis, unspecified: Secondary | ICD-10-CM

## 2012-11-21 DIAGNOSIS — R059 Cough, unspecified: Secondary | ICD-10-CM

## 2012-11-21 DIAGNOSIS — H9209 Otalgia, unspecified ear: Secondary | ICD-10-CM

## 2012-11-21 MED ORDER — PREDNISONE 20 MG PO TABS: 40.0000 mg | ORAL_TABLET | Freq: Every day | ORAL | Status: DC

## 2012-11-21 NOTE — Patient Instructions (Addendum)
Recommend trial of Allegra Complete the prednisone.

## 2012-11-21 NOTE — Progress Notes (Signed)
  Subjective:    Patient ID: Stacey Contreras, female    DOB: 1955-12-24, 57 y.o.   MRN: [id number]  HPI Still has bilat ear pain and pressure. Stil has ST and post nasal drip. Intermittant congestion.  Now eyes are irritated and itchey.  They are red as well.  No fever.  Occ cough, dry, non productive.  She reports a history of multiple allergies. In fact years ago she did an ejection for allergies. He did not do well she had several reactions. She's not been on her prescription allergy medication quite some time. She is fearful of taking antihistamines. Though occasionally uses Advil sinus. She also uses nonprescription nasal steroid as well as eyedrops.   Review of Systems     Objective:   Physical Exam  Constitutional: She is oriented to person, place, and time. She appears well-developed and well-nourished.  HENT:  Head: Normocephalic and atraumatic.  Right Ear: External ear normal.  Left Ear: External ear normal.  Nose: Nose normal.  Mouth/Throat: Oropharynx is clear and moist.  TMs and canals are clear.   Eyes: Conjunctivae and EOM are normal. Pupils are equal, round, and reactive to light.  Neck: Neck supple. No thyromegaly present.  Cardiovascular: Normal rate, regular rhythm and normal heart sounds.   Pulmonary/Chest: Effort normal and breath sounds normal. She has no wheezes.  Lymphadenopathy:    She has no cervical adenopathy.  Neurological: She is alert and oriented to person, place, and time.  Skin: Skin is warm and dry.  Psychiatric: She has a normal mood and affect.          Assessment & Plan:  Allergic rhinitis - her symptoms are consistent with allergic rhinitis versus a bacterial sinus infection. At this point I want to maximize her allergy treatment. I'm going to put her on 5 days of prednisone to get some acute relief and in the meantime she needs to over-the-counter antihistamine. I recommend Allegra, Zyrtec, or Claritin. Most of these, once a day version. She will  need to take it for probably the next 3-4 weeks to get her symptoms under better control. If she's only getting 50% improvement or less than please call the office and we can consider adding a nasal steroid spray.  Ear Pain - Related to AR.

## 2012-12-27 ENCOUNTER — Ambulatory Visit: Payer: BC Managed Care – PPO | Admitting: Family Medicine

## 2012-12-27 ENCOUNTER — Telehealth: Payer: Self-pay | Admitting: *Deleted

## 2012-12-27 NOTE — Telephone Encounter (Signed)
But of course, and I would love to!

## 2012-12-27 NOTE — Telephone Encounter (Signed)
Pt calls and states you gave her an injection in her hand in January and she wants to know if she can get this again and insurance pay for it?

## 2012-12-27 NOTE — Telephone Encounter (Signed)
Pt notified and sent to scheduling. Barry Dienes, LPN

## 2012-12-29 ENCOUNTER — Ambulatory Visit (INDEPENDENT_AMBULATORY_CARE_PROVIDER_SITE_OTHER): Payer: BC Managed Care – PPO | Admitting: Sports Medicine

## 2012-12-29 ENCOUNTER — Encounter: Payer: Self-pay | Admitting: Sports Medicine

## 2012-12-29 VITALS — BP 122/75 | HR 90

## 2012-12-29 DIAGNOSIS — M189 Osteoarthritis of first carpometacarpal joint, unspecified: Secondary | ICD-10-CM

## 2012-12-29 DIAGNOSIS — M19049 Primary osteoarthritis, unspecified hand: Secondary | ICD-10-CM

## 2012-12-29 NOTE — Progress Notes (Signed)
  Subjective:    CC: Left thumb pain.  HPI: This is a very pleasant 57 year old female, she has a history of left trapeziometacarpal arthritis which I injected approximately 5 months ago. She had an excellent response, and pain did not start until a couple of weeks ago. Unfortunately she twisted her thumb by accident, and now has recurrence of pain at the base of her first metacarpal. She has not been using her Mobic. The pain is localized, does not radiate, moderate to severe.  Past medical history, Surgical history, Family history not pertinant except as noted below, Social history, Allergies, and medications have been entered into the medical record, reviewed, and no changes needed.   Review of Systems: No fevers, chills, night sweats, weight loss, chest pain, or shortness of breath.   Objective:    General: Well Developed, well nourished, and in no acute distress.  Neuro: Alert and oriented x3, extra-ocular muscles intact, sensation grossly intact.  HEENT: Normocephalic, atraumatic, pupils equal round reactive to light, neck supple, no masses, no lymphadenopathy, thyroid nonpalpable.  Skin: Warm and dry, no rashes. Cardiac: Regular rate and rhythm, no murmurs rubs or gallops, no lower extremity edema.  Respiratory: Clear to auscultation bilaterally. Not using accessory muscles, speaking in full sentences. Left hand: Tender to palpation at the base of the first metacarpal, no swelling, no bruising. Full range of motion full strength.  Procedure: Real-time Ultrasound Guided Injection of left trapeziometacarpal joint. Device: GE Logiq E  Ultrasound guided injection is preferred based studies that show increased duration, increased effect, greater accuracy, decreased procedural pain, increased response rate, and decreased cost with ultrasound guided versus blind injection.  Verbal informed consent obtained.  Time-out conducted.  Noted no overlying erythema, induration, or other signs of  local infection.  Skin prepped in a sterile fashion.  Local anesthesia: Topical Ethyl chloride.  With sterile technique and under real time ultrasound guidance:  25-gauge needle advanced and short axis into the trapeziometacarpal joint, 0.5 cc Kenalog 40, 1 cc lidocaine injected easily. Completed without difficulty  Pain immediately resolved suggesting accurate placement of the medication.  Advised to call if fevers/chills, erythema, induration, drainage, or persistent bleeding.  Images permanently stored and available for review in the ultrasound unit.  Impression: Technically successful ultrasound guided injection.  Impression and Recommendations:

## 2012-12-29 NOTE — Assessment & Plan Note (Addendum)
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 Guided injection as above. Thumb spica brace. Mobic scheduled. Return on an as needed basis for this.

## 2013-01-20 ENCOUNTER — Emergency Department (INDEPENDENT_AMBULATORY_CARE_PROVIDER_SITE_OTHER): Payer: BC Managed Care – PPO

## 2013-01-20 ENCOUNTER — Emergency Department
Admission: EM | Admit: 2013-01-20 | Discharge: 2013-01-20 | Disposition: A | Discharge status: Home / Self Care | Payer: BC Managed Care – PPO | Source: Home / Self Care | Attending: Emergency Medicine | Admitting: Emergency Medicine

## 2013-01-20 DIAGNOSIS — R509 Fever, unspecified: Secondary | ICD-10-CM

## 2013-01-20 DIAGNOSIS — IMO0001 Reserved for inherently not codable concepts without codable children: Secondary | ICD-10-CM

## 2013-01-20 DIAGNOSIS — R05 Cough: Secondary | ICD-10-CM

## 2013-01-20 DIAGNOSIS — J111 Influenza due to unidentified influenza virus with other respiratory manifestations: Secondary | ICD-10-CM

## 2013-01-20 DIAGNOSIS — R059 Cough, unspecified: Secondary | ICD-10-CM

## 2013-01-20 DIAGNOSIS — R51 Headache: Secondary | ICD-10-CM

## 2013-01-20 LAB — POCT CBC W AUTO DIFF (K'VILLE URGENT CARE)

## 2013-01-20 MED ORDER — OSELTAMIVIR PHOSPHATE 75 MG PO CAPS: ORAL_CAPSULE | ORAL | Status: DC

## 2013-01-20 NOTE — ED Notes (Signed)
Stacey Contreras complains of fever, chills, sweats, headaches, bilateral ear pain, congestion and nausea for 2 days. Fever up to 102.0. She did take tylenol.

## 2013-01-20 NOTE — ED Provider Notes (Signed)
History     CSN: [id number]  Arrival date & time 01/20/13  1124   First MD Initiated Contact with Patient 01/20/13 1136      Chief Complaint  Patient presents with  . Fever  . Chills  . Generalized Body Aches   Patient is a 57 y.o. female presenting with fever. The history is provided by the patient and the spouse.  Fever Temp source:  Oral Severity: 102. Duration:  2 days Timing:  Intermittent Progression:  Waxing and waning Chronicity:  New Relieved by:  Acetaminophen Associated symptoms: chills, cough (Nonproductive.--- With associated bilateral pleuritic chest discomfort,), ear pain (Bilateral for 2 days), headaches (mild, diffuse, without any focal neurologic symptoms or visual changes), myalgias, nausea (Mild, but can tolerate by mouth's. No vomiting) and rhinorrhea (Clear rhinorrhea, postnasal drainage)   Associated symptoms: no chest pain, no confusion, no diarrhea, no dysuria, no rash, no somnolence, no sore throat and no vomiting   Risk factors: recent travel (Was on vacation in [place], returned 5 days ago. Exposed to other  people on vacation who were coughing.)   Risk factors: no immunosuppression and no recent surgery    No history of tick bite. No rash. Past Medical History  Diagnosis Date  . Hypothyroidism   . Depression   . Elevated lipids     Past Surgical History  Procedure Laterality Date  . Cesarean section    . Vaginal hysterectomy      Due to dysmenorrhea    Family History  Problem Relation Age of Onset  . Depression Father     attempted suicide  . Depression Sister     suidcide  . Hypertension Father   . Mitral valve prolapse Mother   . Hypothyroidism Mother     History  Substance Use Topics  . Smoking status: Never Smoker   . Smokeless tobacco: Never Used  . Alcohol Use: Yes     Comment: very rarely    OB History   Grav Para Term Preterm Abortions TAB SAB Ect Mult Living   6 1   5  3 2        Obstetric Comments   Has 2 adopted  children      Review of Systems  Constitutional: Positive for fever, chills and fatigue (Mild).  HENT: Positive for ear pain (Bilateral for 2 days), rhinorrhea (Clear rhinorrhea, postnasal drainage) and postnasal drip. Negative for sore throat, facial swelling, sneezing, mouth sores, neck pain, neck stiffness, sinus pressure and ear discharge.   Eyes: Negative.  Negative for photophobia, pain and visual disturbance.  Respiratory: Positive for cough (Nonproductive.--- With associated bilateral pleuritic chest discomfort,). Negative for shortness of breath and wheezing.   Cardiovascular: Negative for chest pain, palpitations and leg swelling.  Gastrointestinal: Positive for nausea (Mild, but can tolerate by mouth's. No vomiting). Negative for vomiting, abdominal pain, diarrhea and blood in stool.  Genitourinary: Negative for dysuria, hematuria, flank pain, vaginal bleeding and menstrual problem.  Musculoskeletal: Positive for myalgias. Negative for joint swelling.  Skin: Negative for rash.  Neurological: Positive for headaches (mild, diffuse, without any focal neurologic symptoms or visual changes). Negative for seizures and syncope.  Hematological: Negative for adenopathy. Does not bruise/bleed easily.  Psychiatric/Behavioral: Negative for confusion and agitation.    Allergies  Sulfonamide derivatives  Home Medications   Current Outpatient Rx  Name  Route  Sig  Dispense  Refill  . SYNTHROID 112 MCG tablet   Oral   Take 1 tablet (112 mcg total) by  mouth daily.   90 tablet   0     Dispense as written.   . WELLBUTRIN XL 150 MG 24 hr tablet   Oral   Take 2 tablets (300 mg total) by mouth daily.   180 tablet   1     Dispense as written.   Marland Kitchen oseltamivir (TAMIFLU) 75 MG capsule      Starting today, take 1 capsule by mouth twice a day for 5 days.   10 capsule   0   . predniSONE (DELTASONE) 20 MG tablet   Oral   Take 2 tablets (40 mg total) by mouth daily.   10 tablet   0      BP 129/76  Pulse 99  Temp(Src) 98.9 F (37.2 C) (Oral)  Ht 5\' 4"  (1.626 m)  Wt 146 lb (66.225 kg)  BMI 25.05 kg/m2  SpO2 96%  LMP 08/16/1998  Physical Exam  Nursing note and vitals reviewed. Constitutional: She appears well-developed and well-nourished.  Non-toxic appearance. She appears ill (very fatigued, but no cardiorespiratory distress). No distress.  HENT:  Head: Normocephalic and atraumatic.  Right Ear: Tympanic membrane and external ear normal.  Left Ear: Tympanic membrane and external ear normal.  Nose: Rhinorrhea present.  Mouth/Throat: Mucous membranes are normal. No oropharyngeal exudate or posterior oropharyngeal erythema.  Eyes: Conjunctivae are normal. Right eye exhibits no discharge. Left eye exhibits no discharge. No scleral icterus.  Neck: Neck supple. No spinous process tenderness present. No Brudzinski's sign and no Kernig's sign noted.  Cardiovascular: Normal rate, regular rhythm and normal heart sounds.   Pulmonary/Chest: Breath sounds normal. No stridor. No respiratory distress. She has no wheezes. She has no rales.  Abdominal: Soft. There is no tenderness.  Musculoskeletal: She exhibits no edema.  Lymphadenopathy:    She has cervical adenopathy (mild shoddy anterior cervical nodes).  Neurological: She is alert.  Skin: Skin is warm and intact. No rash noted. She is diaphoretic.  Psychiatric: She has a normal mood and affect.   extremities: No cyanosis clubbing or edema  ED Course  Procedures (including critical care time)  Labs Reviewed  INFLUENZA PANEL BY PCR  POCT CBC W AUTO DIFF ([facility])   Dg Chest 2 View  01/20/2013   *RADIOLOGY REPORT*  Clinical Data: Fever and chills.  Cough.  CHEST - 2 VIEW  Comparison: No priors.  Findings: Lung volumes are normal.  No consolidative airspace disease.  No pleural effusions.  No pneumothorax.  No pulmonary nodule or mass noted.  Pulmonary vasculature and the cardiomediastinal silhouette are within  normal limits.  Surgical clips project over the right upper quadrant of the abdomen, likely from prior cholecystectomy.  IMPRESSION: 1. No radiographic evidence of acute cardiopulmonary disease.   Original Report Authenticated By: Trudie Reed, M.D.     1. Flu syndrome       MDM  Clinically, she likely has a flulike syndrome. No rash or arthralgias to suggest Lyme's or [facility] spotted fever. WBC count 4.4, 15% lymphocytes and 81% granulocytes .-Absolute neutrophil count within normal limits. This is consistent with viral syndrome. She may have influenza or another viral syndrome, likely originated from her vacation to [place] or others had a similar illness. Chest x-ray within normal limits, no acute abnormalities. Reviewed with patient and husband. We discussed workup and treatment options with patient and husband. Patient declined extensive other blood work. Nasal swab for flu test performed, results pending, sent to reference lab as test not an in-house  test in our facility at this time.  Advise rest and push fluids. Tylenol and other fever reduction methods discussed. Tamiflu prescribed. Other symptomatic care discussed. Red flags discussed. Followup with PCP if no better one week, or go to ER sooner if any severe worsening symptoms. Patient and husband voiced understanding and agreement with the above.        Lajean Manes, MD 01/20/13 [phone]

## 2013-01-22 ENCOUNTER — Other Ambulatory Visit: Payer: Self-pay | Admitting: Emergency Medicine

## 2013-01-22 ENCOUNTER — Telehealth: Payer: Self-pay | Admitting: *Deleted

## 2013-01-22 LAB — INFLUENZA A AND B

## 2013-01-24 ENCOUNTER — Telehealth: Payer: Self-pay | Admitting: Emergency Medicine

## 2013-02-22 ENCOUNTER — Other Ambulatory Visit: Payer: Self-pay | Admitting: *Deleted

## 2013-02-22 MED ORDER — WELLBUTRIN XL 150 MG PO TB24: 300.0000 mg | ORAL_TABLET | Freq: Every day | ORAL | Status: DC

## 2013-03-01 ENCOUNTER — Encounter: Payer: Self-pay | Admitting: Family Medicine

## 2013-03-01 ENCOUNTER — Ambulatory Visit (INDEPENDENT_AMBULATORY_CARE_PROVIDER_SITE_OTHER): Payer: Self-pay | Admitting: Family Medicine

## 2013-03-01 VITALS — BP 136/72 | HR 94 | Ht 64.0 in | Wt 145.0 lb

## 2013-03-01 DIAGNOSIS — Z1211 Encounter for screening for malignant neoplasm of colon: Secondary | ICD-10-CM

## 2013-03-01 DIAGNOSIS — Z9189 Other specified personal risk factors, not elsewhere classified: Secondary | ICD-10-CM

## 2013-03-01 DIAGNOSIS — Z1231 Encounter for screening mammogram for malignant neoplasm of breast: Secondary | ICD-10-CM

## 2013-03-01 DIAGNOSIS — N9489 Other specified conditions associated with female genital organs and menstrual cycle: Secondary | ICD-10-CM

## 2013-03-01 DIAGNOSIS — R102 Pelvic and perineal pain: Secondary | ICD-10-CM

## 2013-03-01 DIAGNOSIS — R350 Frequency of micturition: Secondary | ICD-10-CM

## 2013-03-01 LAB — CBC WITH DIFFERENTIAL/PLATELET
Basophils Absolute: 0 10*3/uL (ref 0.0–0.1)
Basophils Relative: 0 % (ref 0–1)
Eosinophils Absolute: 0.2 10*3/uL (ref 0.0–0.7)
Eosinophils Relative: 2 % (ref 0–5)
HCT: 42.9 % (ref 36.0–46.0)
Hemoglobin: 15 g/dL (ref 12.0–15.0)
Lymphocytes Relative: 22 % (ref 12–46)
Lymphs Abs: 1.6 10*3/uL (ref 0.7–4.0)
MCH: 32.3 pg (ref 26.0–34.0)
MCHC: 35 g/dL (ref 30.0–36.0)
MCV: 92.5 fL (ref 78.0–100.0)
Monocytes Absolute: 0.7 10*3/uL (ref 0.1–1.0)
Monocytes Relative: 10 % (ref 3–12)
Neutro Abs: 4.6 10*3/uL (ref 1.7–7.7)
Neutrophils Relative %: 66 % (ref 43–77)
Platelets: 358 10*3/uL (ref 150–400)
RBC: 4.64 MIL/uL (ref 3.87–5.11)
RDW: 14.1 % (ref 11.5–15.5)
WBC: 7 10*3/uL (ref 4.0–10.5)

## 2013-03-01 LAB — POCT URINALYSIS DIPSTICK
Bilirubin, UA: NEGATIVE
Blood, UA: NEGATIVE
Glucose, UA: NEGATIVE
Nitrite, UA: NEGATIVE
Protein, UA: NEGATIVE
Spec Grav, UA: >=1.03
Urobilinogen, UA: 0.2
pH, UA: 5.5

## 2013-03-01 MED ORDER — CIPROFLOXACIN HCL 500 MG PO TABS: 500.0000 mg | ORAL_TABLET | Freq: Two times a day (BID) | ORAL | Status: AC

## 2013-03-01 NOTE — Addendum Note (Signed)
Addended by: Deno Etienne on: 03/01/2013 04:14 PM   Modules accepted: Orders

## 2013-03-01 NOTE — Patient Instructions (Signed)
If everything is normal then we can schedule a pelvic US.

## 2013-03-01 NOTE — Progress Notes (Signed)
Subjective:    Patient ID: Stacey Contreras, female    DOB: 1955/10/29, 57 y.o.   MRN: [id number]  HPI Has had some pelvic pressure and urgency the last 3 days.  About 5 weeks ago had the flu and then aobut 2-3 week ago had poison ivey. NO dysuria. She feels really bloating.  She went online and now she is concerned about ovarian cancer.  Hx of DES exposure (mother). She denies any constipation. Back she says her bowels moved about 4 times yesterday. The last episode was more loose. No blood in the stool. No blood in the urine. No fevers or chills or sweats. She has lost about 6 pounds in the last couple months and says it is unintentional. She has not changed her diet or exercise level and she is concerned.   Review of Systems BP 136/72  Pulse 94  Ht 5\' 4"  (1.626 m)  Wt 145 lb (65.772 kg)  BMI 24.88 kg/m2  LMP 08/16/1998    Allergies  Allergen Reactions  . Sulfonamide Derivatives   . Tamiflu (Oseltamivir Phosphate) Other (See Comments)    Irritabilty, mood change     Past Medical History  Diagnosis Date  . Hypothyroidism   . Depression   . Elevated lipids     Past Surgical History  Procedure Laterality Date  . Cesarean section    . Vaginal hysterectomy      Due to dysmenorrhea    History   Social History  . Marital Status: Married    Spouse Name: N/A    Number of Children: 2  . Years of Education: N/A   Occupational History  . homemaker    Social History Main Topics  . Smoking status: Never Smoker   . Smokeless tobacco: Never Used  . Alcohol Use: Yes     Comment: very rarely  . Drug Use: No  . Sexually Active: Yes -- Female partner(s)   Other Topics Concern  . Not on file   Social History Narrative  . No narrative on file    Family History  Problem Relation Age of Onset  . Depression Father     attempted suicide  . Depression Sister     suidcide  . Hypertension Father   . Mitral valve prolapse Mother   . Hypothyroidism Mother     Outpatient  Encounter Prescriptions as of 03/01/2013  Medication Sig Dispense Refill  . SYNTHROID 112 MCG tablet Take 1 tablet (112 mcg total) by mouth daily.  90 tablet  0  . WELLBUTRIN XL 150 MG 24 hr tablet Take 2 tablets (300 mg total) by mouth daily.  180 tablet  1  . ciprofloxacin (CIPRO) 500 MG tablet Take 1 tablet (500 mg total) by mouth 2 (two) times daily.  6 tablet  0  . [DISCONTINUED] oseltamivir (TAMIFLU) 75 MG capsule Starting today, take 1 capsule by mouth twice a day for 5 days.  10 capsule  0  . [DISCONTINUED] predniSONE (DELTASONE) 20 MG tablet Take 2 tablets (40 mg total) by mouth daily.  10 tablet  0   No facility-administered encounter medications on file as of 03/01/2013.          Objective:   Physical Exam  Constitutional: She appears well-developed and well-nourished.  Abdominal: Soft. Bowel sounds are normal. She exhibits distension. She exhibits no mass. There is tenderness. There is no rebound and no guarding.  Right LQ tenderness that is close to the suprabupbic area  Neurological: She is alert.  Skin: Skin is warm and dry. Rash noted.  Extensive poison ivey on her legs   Psychiatric: She has a normal mood and affect. Her behavior is normal.          Assessment & Plan:  Pelvic Pain - UA + for leuk will start ABX. Will send a urine for culture. She's also very worried about getting cancer so I did order a CA 125 on her today. We'll also check a CBC. If all of her blood work is normal in her urine cultures negative and she's not feeling better by Monday of next week then we will order a pelvic and abdominal ultrasound for further evaluation.

## 2013-03-02 LAB — CA 125: CA 125: 5.4 U/mL (ref 0.0–30.2)

## 2013-03-03 LAB — URINE CULTURE
Colony Count: NO GROWTH
Organism ID, Bacteria: NO GROWTH

## 2013-03-22 ENCOUNTER — Ambulatory Visit (INDEPENDENT_AMBULATORY_CARE_PROVIDER_SITE_OTHER): Payer: BC Managed Care – PPO

## 2013-03-22 DIAGNOSIS — Z1231 Encounter for screening mammogram for malignant neoplasm of breast: Secondary | ICD-10-CM

## 2013-04-26 ENCOUNTER — Encounter: Payer: Self-pay | Admitting: Family Medicine

## 2013-04-26 ENCOUNTER — Ambulatory Visit (INDEPENDENT_AMBULATORY_CARE_PROVIDER_SITE_OTHER): Payer: BC Managed Care – PPO | Admitting: Family Medicine

## 2013-04-26 VITALS — BP 122/68 | HR 83 | Temp 98.4°F | Wt 146.0 lb

## 2013-04-26 DIAGNOSIS — J029 Acute pharyngitis, unspecified: Secondary | ICD-10-CM

## 2013-04-26 DIAGNOSIS — Z23 Encounter for immunization: Secondary | ICD-10-CM

## 2013-04-26 LAB — POCT RAPID STREP A (OFFICE): Rapid Strep A Screen: NEGATIVE

## 2013-04-26 MED ORDER — MONTELUKAST SODIUM 10 MG PO TABS: 10.0000 mg | ORAL_TABLET | Freq: Every day | ORAL | Status: DC

## 2013-04-26 MED ORDER — BECLOMETHASONE DIPROPIONATE 80 MCG/ACT NA AERS: 1.0000 | INHALATION_SPRAY | Freq: Every day | NASAL | Status: DC

## 2013-04-26 NOTE — Progress Notes (Signed)
  Subjective:    Patient ID: Stacey Contreras, female    DOB: [birth year], 57 y.o.   MRN: [id number]  HPI Right ear painful x 1-2 weeks.  Ear is better today but now has a lump in her throat.  She says it is painful to swallow as well.  Had similar sxs about 5 months ago but says hasn't felt right since then.  No fever.  + fatigued.  No OTC meds.  Did do allergy shots at one time, but says every time she would get them she would have a reaction to wait for several hours and so she finally does quit doing them. She has been using her Nasonex regularly but has not been taking an antihistamine. She said she did buy them and put them in the closet but forgot to take them. She does have known allergies to certain grasses, weeds, mold, and mildew.   Review of Systems     Objective:   Physical Exam  Constitutional: She is oriented to person, place, and time. She appears well-developed and well-nourished.  HENT:  Head: Normocephalic and atraumatic.  Right Ear: External ear normal.  Left Ear: External ear normal.  Nose: Nose normal.  Mouth/Throat: Oropharynx is clear and moist.  TMs and canals are clear.   Eyes: Conjunctivae and EOM are normal. Pupils are equal, round, and reactive to light.  Neck: Neck supple. No thyromegaly present.  Cardiovascular: Normal rate, regular rhythm and normal heart sounds.   Pulmonary/Chest: Effort normal and breath sounds normal. She has no wheezes.  Lymphadenopathy:    She has no cervical adenopathy.  Neurological: She is alert and oriented to person, place, and time.  Skin: Skin is warm and dry.  Psychiatric: She has a normal mood and affect.          Assessment & Plan:  AR - Will change from nasonex to Qnasl. Will start antihistamine OTC.  I will also call her for a perception for Singulair. She has a known history to multiple grasses, molds, and mildew. I discussed with her that at this point which is seen to really maximize her therapy for allergies. If she's  not feeling significantly better by Monday or Tuesday then please let me know. She's not interested in doing allergy shots at this time that she had reactions to them in the past.  Otalgia-most likely eustachian tube dysfunction as her ear exam is normal today and she is definitely having significant allergy symptoms.  Flu shot given today.

## 2013-04-30 ENCOUNTER — Ambulatory Visit: Payer: BC Managed Care – PPO | Admitting: Family Medicine

## 2013-05-01 IMAGING — CR DG TIBIA/FIBULA 2V*L*
2 series · 2 of 2 positions shown · non-contrast
Comparison: None.

CLINICAL DATA: Anterior left tibia-fibula region pain without
injury.

LEFT TIBIA AND FIBULA - 2 VIEW

[view not recorded (1 of 2)]
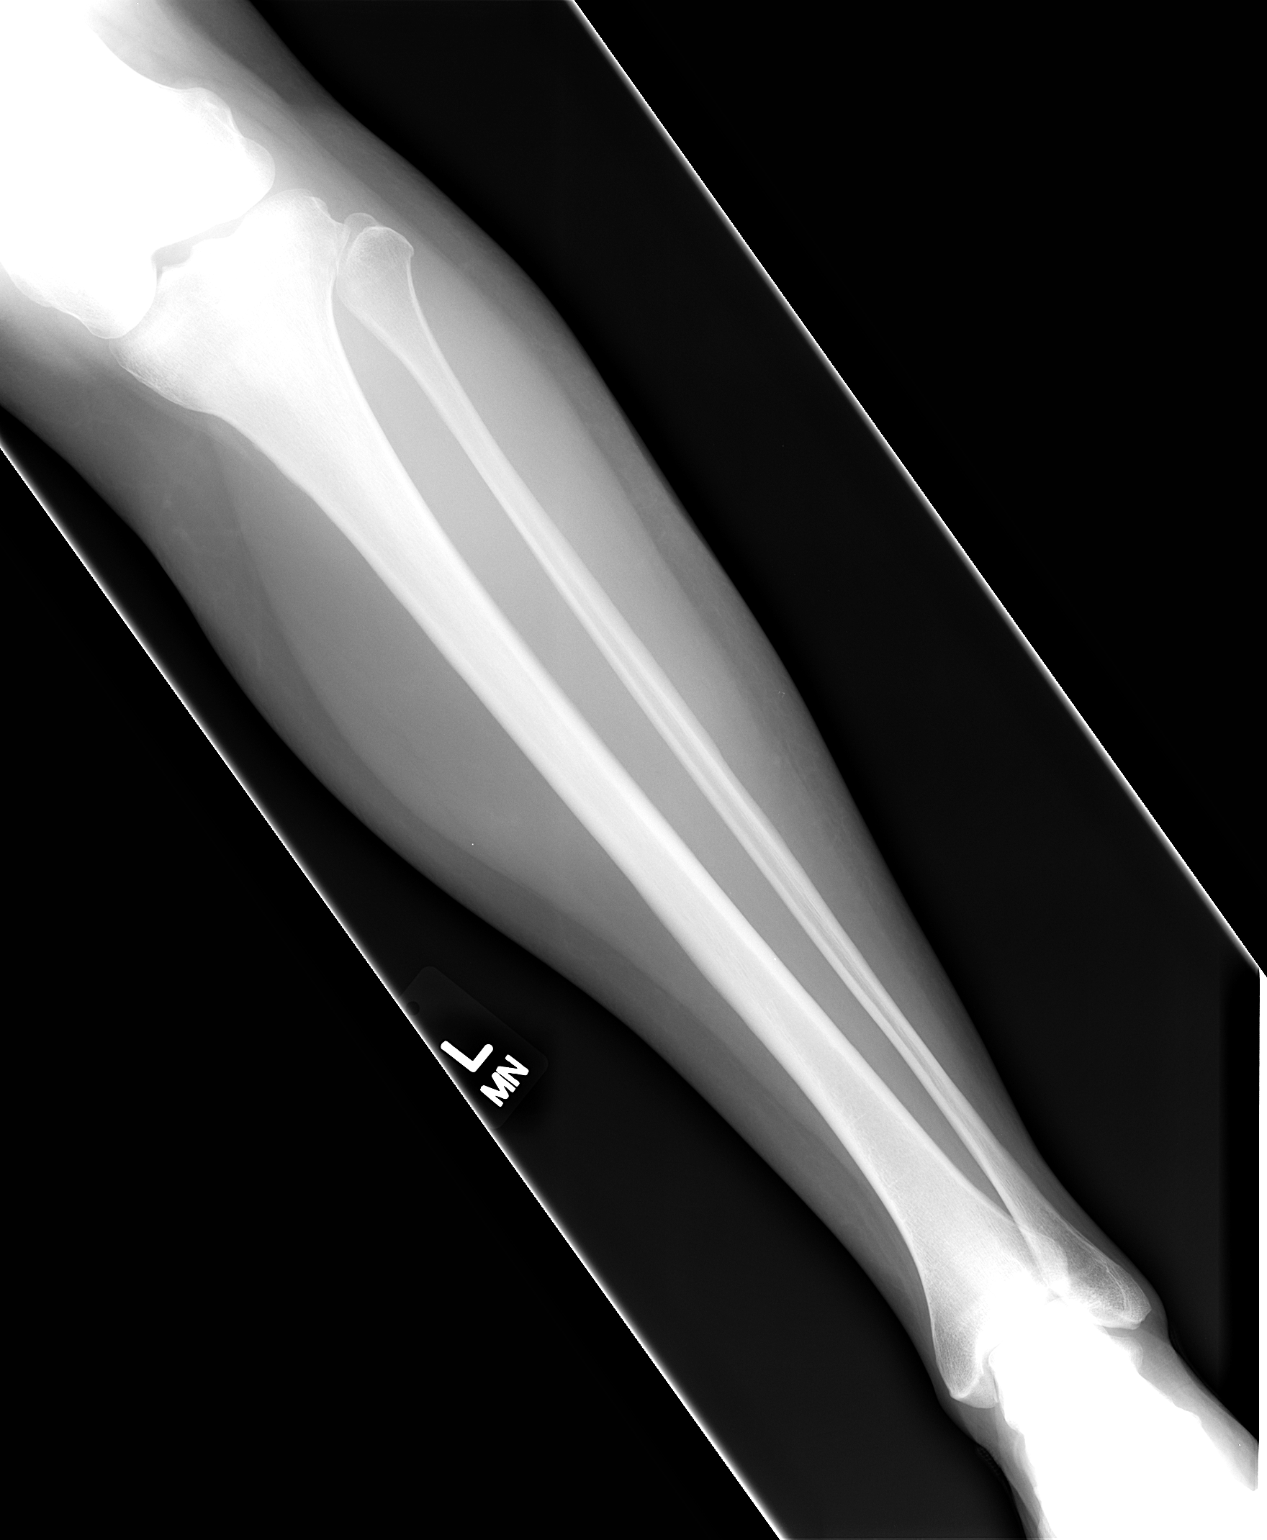

[view not recorded (2 of 2)]
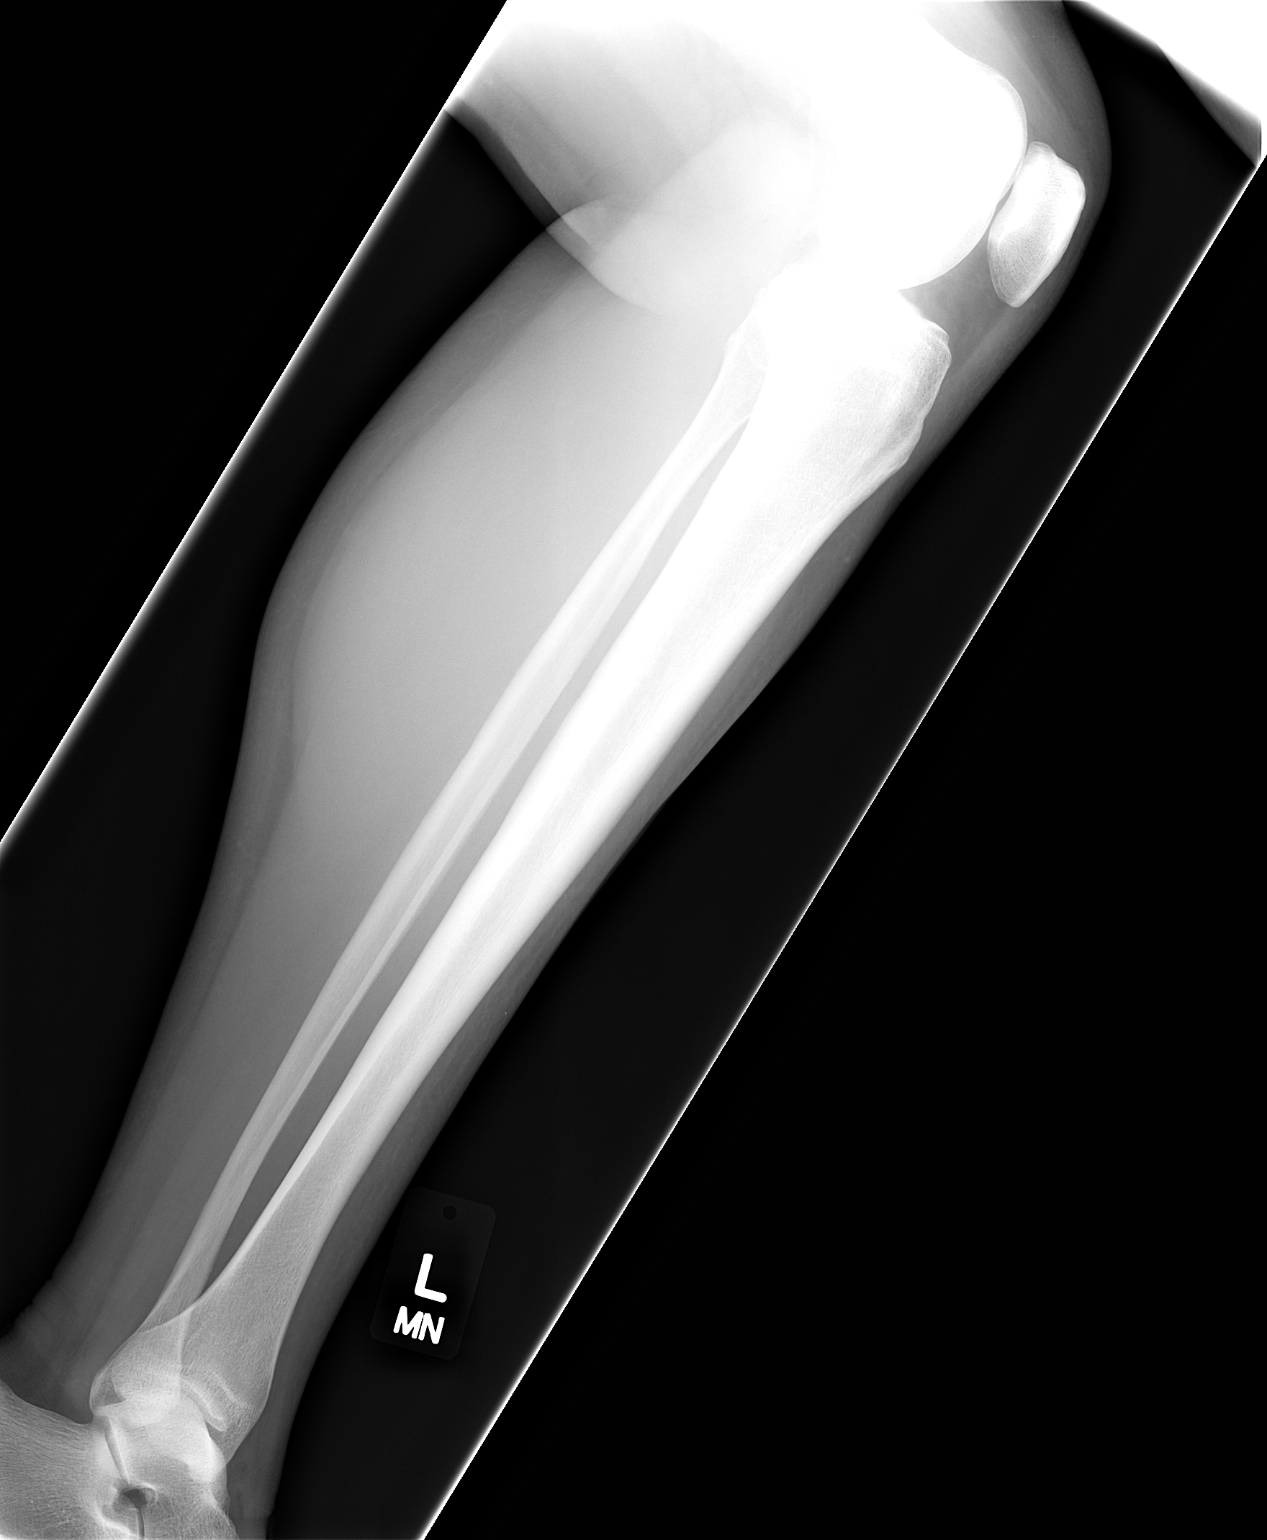

[2 of 2 positions shown; findings below may reference images not displayed]

FINDINGS: No significant osseous, articular, or soft tissue
abnormalities seen.
IMPRESSION: Negative.

## 2013-05-09 ENCOUNTER — Encounter: Payer: Self-pay | Admitting: Family Medicine

## 2013-07-09 ENCOUNTER — Other Ambulatory Visit: Payer: Self-pay | Admitting: Family Medicine

## 2013-07-09 MED ORDER — WELLBUTRIN XL 150 MG PO TB24: 300.0000 mg | ORAL_TABLET | Freq: Every day | ORAL | Status: DC

## 2013-08-27 ENCOUNTER — Ambulatory Visit (INDEPENDENT_AMBULATORY_CARE_PROVIDER_SITE_OTHER): Payer: BC Managed Care – PPO | Admitting: Family Medicine

## 2013-08-27 ENCOUNTER — Encounter: Payer: Self-pay | Admitting: Family Medicine

## 2013-08-27 VITALS — BP 140/72 | HR 97 | Temp 98.9°F | Ht 64.0 in | Wt 149.0 lb

## 2013-08-27 DIAGNOSIS — M189 Osteoarthritis of first carpometacarpal joint, unspecified: Secondary | ICD-10-CM

## 2013-08-27 DIAGNOSIS — M19049 Primary osteoarthritis, unspecified hand: Secondary | ICD-10-CM

## 2013-08-27 NOTE — Progress Notes (Signed)
   Subjective:    Patient ID: Stacey Contreras, female    DOB: 02-09-1956, 58 y.o.   MRN: [id number]  HPI Continues to having left thumb pain.  Had an injection 1 year ago.  Worked well intially she came back in May for repeat injection. She said it actually stayed bruised for him is 3-4 months. It has been persistently swollen since then. She continues to have significant pain and stiffness. It's hard to lift things or do things.   Review of Systems     Objective:   Physical Exam  Constitutional: She is oriented to person, place, and time. She appears well-developed and well-nourished.  HENT:  Head: Normocephalic and atraumatic.  Musculoskeletal:  Swelling at the left [facility] joint  Tender to palpation. Some tenderness at the wrist as well. Slightly dec extension, o/w NROM  Neurological: She is alert and oriented to person, place, and time.  Skin: Skin is warm and dry.  Psychiatric: She has a normal mood and affect. Her behavior is normal.          Assessment & Plan:  Arthritis of [place]- will refer to Hand surgeon at this point since really not better.  Can start wear thumb spica splint again for support. Call if any changes befoe sees surgeon.

## 2013-08-28 ENCOUNTER — Telehealth: Payer: Self-pay | Admitting: Family Medicine

## 2013-08-28 NOTE — Telephone Encounter (Signed)
Wait for appt with Dr. Amedeo Plenty.  Or REally we can consider  Dr. Milly Jakob at [facility] ortho if you want [place] to see if he has availability. He is good too.

## 2013-08-28 NOTE — Telephone Encounter (Signed)
Pt is able to get an appt  sooner  with Dr Caralyn Guile  than Dr Amedeo Plenty at [facility]. She wants to know what you think about Dr. Caralyn Guile.  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

## 2013-08-29 NOTE — Telephone Encounter (Signed)
Pt notified and will wait to see Dr. Amedeo Plenty. Clemetine Marker, LPN

## 2013-09-03 ENCOUNTER — Telehealth: Payer: Self-pay | Admitting: *Deleted

## 2013-09-03 NOTE — Telephone Encounter (Signed)
Pt called to find out which ortho dr she was referred to  Told her it was Dr Roseanne Kaufman .Stacey Contreras [place]

## 2013-12-05 ENCOUNTER — Other Ambulatory Visit: Payer: Self-pay | Admitting: Family Medicine

## 2013-12-05 ENCOUNTER — Other Ambulatory Visit: Payer: Self-pay | Admitting: *Deleted

## 2013-12-05 MED ORDER — WELLBUTRIN XL 150 MG PO TB24: 300.0000 mg | ORAL_TABLET | Freq: Every day | ORAL | Status: DC

## 2014-01-25 NOTE — Telephone Encounter (Signed)
error 

## 2014-05-21 ENCOUNTER — Other Ambulatory Visit: Payer: Self-pay | Admitting: Family Medicine

## 2014-06-17 ENCOUNTER — Encounter: Payer: Self-pay | Admitting: Family Medicine

## 2014-06-18 ENCOUNTER — Other Ambulatory Visit: Payer: Self-pay | Admitting: Family Medicine

## 2014-06-18 DIAGNOSIS — Z1231 Encounter for screening mammogram for malignant neoplasm of breast: Secondary | ICD-10-CM

## 2014-06-20 ENCOUNTER — Ambulatory Visit (INDEPENDENT_AMBULATORY_CARE_PROVIDER_SITE_OTHER): Payer: BC Managed Care – PPO

## 2014-06-20 DIAGNOSIS — Z1231 Encounter for screening mammogram for malignant neoplasm of breast: Secondary | ICD-10-CM

## 2014-07-15 ENCOUNTER — Other Ambulatory Visit: Payer: Self-pay | Admitting: *Deleted

## 2014-07-15 MED ORDER — WELLBUTRIN XL 150 MG PO TB24: ORAL_TABLET | ORAL | Status: DC

## 2014-07-15 NOTE — Progress Notes (Signed)
Wellbutrin refilled after patient scheduled a follow up appt. Margette Fast, CMA

## 2014-07-16 ENCOUNTER — Encounter: Payer: Self-pay | Admitting: Family Medicine

## 2014-07-16 ENCOUNTER — Ambulatory Visit (INDEPENDENT_AMBULATORY_CARE_PROVIDER_SITE_OTHER): Payer: BC Managed Care – PPO | Admitting: Family Medicine

## 2014-07-16 ENCOUNTER — Ambulatory Visit (INDEPENDENT_AMBULATORY_CARE_PROVIDER_SITE_OTHER): Payer: BC Managed Care – PPO | Admitting: *Deleted

## 2014-07-16 VITALS — BP 115/62 | HR 90 | Temp 97.0°F | Ht 64.0 in | Wt 148.0 lb

## 2014-07-16 DIAGNOSIS — F329 Major depressive disorder, single episode, unspecified: Secondary | ICD-10-CM

## 2014-07-16 DIAGNOSIS — R234 Changes in skin texture: Secondary | ICD-10-CM

## 2014-07-16 DIAGNOSIS — Z23 Encounter for immunization: Secondary | ICD-10-CM

## 2014-07-16 DIAGNOSIS — F32A Depression, unspecified: Secondary | ICD-10-CM

## 2014-07-16 MED ORDER — WELLBUTRIN XL 150 MG PO TB24: 450.0000 mg | ORAL_TABLET | Freq: Every day | ORAL | Status: DC

## 2014-07-16 MED ORDER — TRIAMCINOLONE ACETONIDE 0.5 % EX OINT
1.0000 | TOPICAL_OINTMENT | Freq: Every day | CUTANEOUS | Status: DC
Start: 2014-07-16 — End: 2014-11-26

## 2014-07-16 NOTE — Progress Notes (Signed)
   Subjective:    Patient ID: Stacey Contreras, female    DOB: 1955-09-05, 58 y.o.   MRN: [id number]  HPI Here to follow-up on depression-she's been under a lot of stress recently. She has a son has some learning disabilities and Tourette's. He is now 57 year old is old and living at home. But he is making a lot of bad decisions and he is not willing to see a therapist or counselor. In fact they had to call the police today to remove his girlfriend from their home. Just creates a lot of drama and increased stress for her. She has been on Wellbutrin for probably 10 years. She's currently taking 450 mg daily. She feels like it is effective and works well and she denies any side effects on the medication.  Note, she did have hand surgery since I last saw her. She says it was extremely painful that is healed well. Next  She's noticed some splitting of the skin on her fingertips particularly on her right hand compared to her left. She denies any itching or irritation. It started several weeks ago as the weather started to get cooler. She's been trying some over-the-counter products and lotions without any major success. She was having some cracking on her heels as well and started using Cerave and that actually has helped.   Review of Systems     Objective:   Physical Exam  Constitutional: She is oriented to person, place, and time. She appears well-developed and well-nourished.  HENT:  Head: Normocephalic and atraumatic.  Cardiovascular: Normal rate, regular rhythm and normal heart sounds.   Pulmonary/Chest: Effort normal and breath sounds normal.  Neurological: She is alert and oriented to person, place, and time.  Skin: Skin is warm and dry.  Fingertips especially on thumb and third and fourth fingers with some splitting at the tips. No other significant nail deformity.  Psychiatric: She has a normal mood and affect. Her behavior is normal.          Assessment & Plan:  Depression-under good  control currently on Wellbutrin. Continue current regimen. Follow-up in 6 months. She might also benefit from some therapy or counseling herself.  Cracked skin on fingertips-discussed the importance importance of moisturizing. In fact encourage her to use Eucerin cream and then apply soft cotton gloves at bedtime and leave on overnight. I will also give her a topical steroid cream to apply underneath the moisturizer once a day to promote healing. Avoid excessive washing of the hands if possible. And avoid a lot of chemical exposure cleaners etc.

## 2014-11-07 LAB — T4, FREE: Free T4: 1.37

## 2014-11-07 LAB — TSH: TSH: 1.18 u[IU]/mL (ref <=5.90)

## 2014-11-26 ENCOUNTER — Encounter: Payer: Self-pay | Admitting: Family Medicine

## 2014-11-26 ENCOUNTER — Ambulatory Visit (INDEPENDENT_AMBULATORY_CARE_PROVIDER_SITE_OTHER): Payer: 59 | Admitting: Family Medicine

## 2014-11-26 VITALS — BP 124/76 | HR 96 | Temp 98.1°F | Ht 64.0 in | Wt 151.0 lb

## 2014-11-26 DIAGNOSIS — F32A Depression, unspecified: Secondary | ICD-10-CM

## 2014-11-26 DIAGNOSIS — L301 Dyshidrosis [pompholyx]: Secondary | ICD-10-CM | POA: Diagnosis not present

## 2014-11-26 DIAGNOSIS — E039 Hypothyroidism, unspecified: Secondary | ICD-10-CM

## 2014-11-26 DIAGNOSIS — F329 Major depressive disorder, single episode, unspecified: Secondary | ICD-10-CM

## 2014-11-26 DIAGNOSIS — L989 Disorder of the skin and subcutaneous tissue, unspecified: Secondary | ICD-10-CM | POA: Diagnosis not present

## 2014-11-26 DIAGNOSIS — R5383 Other fatigue: Secondary | ICD-10-CM | POA: Diagnosis not present

## 2014-11-26 MED ORDER — BUPROPION HCL ER (XL) 150 MG PO TB24: 300.0000 mg | ORAL_TABLET | Freq: Every day | ORAL | Status: DC

## 2014-11-26 MED ORDER — CLOBETASOL PROPIONATE 0.05 % EX CREA: 1.0000 "application " | TOPICAL_CREAM | Freq: Two times a day (BID) | CUTANEOUS | Status: DC

## 2014-11-26 NOTE — Progress Notes (Signed)
   Subjective:    Patient ID: Stacey Contreras, female    DOB: 11-14-1955, 59 y.o.   MRN: [id number]  HPI She would like ot discuss Wellbutrin. Says her insurance will no long cover the Brand.  She brought in a list of covered drugs.  She has tried the generic in the past and unfortunately she felt like it caused mood swings. This was about 4 years ago.  Bump on the right leg that won't heal. Has been burning and cracking. Tender to touch at times. No active drainage. She's not sure any ointments etc. She has been on her knees a lot gardening.  She has felt more fatigued lately. She just saw Dr. Mare Ferrari, her endocrinologist and her thyroid looked great. She's been very active with 2 new puppies and also has felt down somewhat. She really misses living in [place].  She still having a lot of cracking at her fingertips. Had continent ointment for her and she felt like this was too greasy and problematic. She typically puts Band-Aids on each fingertip at night to help seal and the moisture in this does help.   Review of Systems     Objective:   Physical Exam  Constitutional: She is oriented to person, place, and time. She appears well-developed and well-nourished.  HENT:  Head: Normocephalic and atraumatic.  Eyes: Conjunctivae and EOM are normal.  Cardiovascular: Normal rate.   Pulmonary/Chest: Effort normal.  Neurological: She is alert and oriented to person, place, and time.  Skin: Skin is dry. No pallor.  Small 1.5 cm circular area on knee with dry cracked skin. Well circumscribed. No drainage or erythema. He does have excessive dryness and cracking at the fingertips on both hands.  Psychiatric: She has a normal mood and affect. Her behavior is normal.          Assessment & Plan:  Depression-she's on fantastic on Wellbutrin 3 her milligrams extended release daily. She is willing to retry the generic and see if she does well. If not we can consider writing a letter to her insurance company  to see if they will cover the brand. The other alternative would be for her to take the regular Wellbutrin 100 mg 3 times a day, branded, which is on her formulary. I just worry about the consistency with a 3 times a day medication.  Skin lesion left knee-it just appears to be thickened dry cracked skin. Will treat with topical steroid. If not healing over the next 2 weeks and please let me know. We'll be happy to refer to dermatology if needed.  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 We'll see if this is more helpful. Continue moisturizing aggressively.  Fatigue - working with endocrinology on this.    Hypothyroidism - sees Dr. Mare Ferrari. Well controlled

## 2015-01-03 ENCOUNTER — Encounter: Payer: Self-pay | Admitting: Family Medicine

## 2015-01-03 ENCOUNTER — Ambulatory Visit (INDEPENDENT_AMBULATORY_CARE_PROVIDER_SITE_OTHER): Payer: 59 | Admitting: Family Medicine

## 2015-01-03 VITALS — BP 128/64 | HR 90 | Temp 98.6°F | Ht 65.0 in | Wt 151.0 lb

## 2015-01-03 DIAGNOSIS — R10814 Left lower quadrant abdominal tenderness: Secondary | ICD-10-CM | POA: Diagnosis not present

## 2015-01-03 DIAGNOSIS — R197 Diarrhea, unspecified: Secondary | ICD-10-CM | POA: Diagnosis not present

## 2015-01-03 DIAGNOSIS — R1084 Generalized abdominal pain: Secondary | ICD-10-CM

## 2015-01-03 LAB — CBC WITH DIFFERENTIAL/PLATELET
Basophils Absolute: 0 10*3/uL (ref 0.0–0.1)
Basophils Relative: 1 % (ref 0–1)
Eosinophils Absolute: 0.2 10*3/uL (ref 0.0–0.7)
Eosinophils Relative: 5 % (ref 0–5)
HCT: 44.9 % (ref 36.0–46.0)
Hemoglobin: 15.3 g/dL — ABNORMAL HIGH (ref 12.0–15.0)
Lymphocytes Relative: 32 % (ref 12–46)
Lymphs Abs: 1.4 10*3/uL (ref 0.7–4.0)
MCH: 31.8 pg (ref 26.0–34.0)
MCHC: 34.1 g/dL (ref 30.0–36.0)
MCV: 93.3 fL (ref 78.0–100.0)
MPV: 9 fL (ref 8.6–12.4)
Monocytes Absolute: 0.4 10*3/uL (ref 0.1–1.0)
Monocytes Relative: 9 % (ref 3–12)
Neutro Abs: 2.3 10*3/uL (ref 1.7–7.7)
Neutrophils Relative %: 53 % (ref 43–77)
Platelets: 339 10*3/uL (ref 150–400)
RBC: 4.81 MIL/uL (ref 3.87–5.11)
RDW: 13.3 % (ref 11.5–15.5)
WBC: 4.4 10*3/uL (ref 4.0–10.5)

## 2015-01-03 LAB — COMPLETE METABOLIC PANEL WITH GFR
ALT: 23 U/L (ref 0–35)
AST: 21 U/L (ref 0–37)
Albumin: 4 g/dL (ref 3.5–5.2)
Alkaline Phosphatase: 75 U/L (ref 39–117)
BUN: 15 mg/dL (ref 6–23)
CO2: 30 mEq/L (ref 19–32)
Calcium: 9.4 mg/dL (ref 8.4–10.5)
Chloride: 104 mEq/L (ref 96–112)
Creat: 0.85 mg/dL (ref 0.50–1.10)
GFR, Est African American: 87 mL/min
GFR, Est Non African American: 76 mL/min
Glucose, Bld: 68 mg/dL — ABNORMAL LOW (ref 70–99)
Potassium: 4.9 mEq/L (ref 3.5–5.3)
Sodium: 141 mEq/L (ref 135–145)
Total Bilirubin: 0.5 mg/dL (ref 0.2–1.2)
Total Protein: 7.1 g/dL (ref 6.0–8.3)

## 2015-01-03 LAB — LIPASE: Lipase: 44 U/L (ref 0–75)

## 2015-01-03 NOTE — Patient Instructions (Signed)

## 2015-01-03 NOTE — Progress Notes (Signed)
   Subjective:    Patient ID: Stacey Contreras, female    DOB: July 12, 1956, 59 y.o.   MRN: [id number]  HPI Diarrhea x 1 week. No recent ABX expo. No other known sick contacts.  Hasn't tried something OTC. No travel or changes in diet.  + chills. No fever. Having 3 BMs per day. Feels extremely sleep.  Feels very bloated.  Stools are watery and explosive. No blood in the stool.  Has been really nauseated as well.  No vomiting.  Says on Monday night when out to eat for her anniversary and by the end of the meal her stomach was cramping painfully. Had steak, salad, potatoes, and glass of wine.   Review of Systems     Objective:   Physical Exam  Constitutional: She is oriented to person, place, and time. She appears well-developed and well-nourished.  HENT:  Head: Normocephalic and atraumatic.  Cardiovascular: Normal rate, regular rhythm and normal heart sounds.   Pulmonary/Chest: Effort normal and breath sounds normal.  Abdominal: Soft. Bowel sounds are normal. She exhibits no distension and no mass. There is tenderness. There is no rebound and no guarding.  TTP in the suprapubic area and the LLQ.   Neurological: She is alert and oriented to person, place, and time.  Skin: Skin is warm and dry.  Psychiatric: She has a normal mood and affect. Her behavior is normal.          Assessment & Plan:  Diarrhea - likely viral vs bacterial vs food poisoning.  Will get labs and stool culture today.  Work on staying hydrated.  Will call with results when available.  Can use immodium PRN/

## 2015-01-07 LAB — STOOL CULTURE

## 2015-01-07 NOTE — Addendum Note (Signed)
Addended by: Beatrice Lecher D on: 01/07/2015 05:26 PM   Modules accepted: Orders

## 2015-01-08 ENCOUNTER — Encounter: Payer: Self-pay | Admitting: Physician Assistant

## 2015-01-10 ENCOUNTER — Telehealth: Payer: Self-pay

## 2015-01-10 DIAGNOSIS — Z0100 Encounter for examination of eyes and vision without abnormal findings: Secondary | ICD-10-CM

## 2015-01-10 NOTE — Telephone Encounter (Signed)
Pt called requesting a opthalmology referral due to requirement from her insurance.

## 2015-01-23 ENCOUNTER — Ambulatory Visit (INDEPENDENT_AMBULATORY_CARE_PROVIDER_SITE_OTHER): Payer: 59 | Admitting: Physician Assistant

## 2015-01-23 ENCOUNTER — Encounter: Payer: Self-pay | Admitting: Physician Assistant

## 2015-01-23 VITALS — BP 128/74 | HR 88 | Ht 63.75 in | Wt 149.0 lb

## 2015-01-23 DIAGNOSIS — K589 Irritable bowel syndrome without diarrhea: Secondary | ICD-10-CM | POA: Diagnosis not present

## 2015-01-23 DIAGNOSIS — Z1211 Encounter for screening for malignant neoplasm of colon: Secondary | ICD-10-CM | POA: Diagnosis not present

## 2015-01-23 MED ORDER — MOVIPREP 100 G PO SOLR: 1.0000 | ORAL | Status: DC

## 2015-01-23 MED ORDER — SACCHAROMYCES BOULARDII 250 MG PO CAPS: 250.0000 mg | ORAL_CAPSULE | Freq: Two times a day (BID) | ORAL | Status: DC

## 2015-01-23 NOTE — Patient Instructions (Signed)
We sent prescriptions to [facility] [address], . 1. Moviprep for the colonoscopy 2. Florastor probiotic  You have been scheduled for a colonoscopy. Please follow written instructions given to you at your visit today.  Please pick up your prep supplies at the pharmacy within the next 1-3 days. If you use inhalers (even only as needed), please bring them with you on the day of your procedure. Your physician has requested that you go to www.startemmi.com and enter the access code given to you at your visit today. This web site gives a general overview about your procedure. However, you should still follow specific instructions given to you by our office regarding your preparation for the procedure.

## 2015-01-23 NOTE — Progress Notes (Signed)
Patient ID: Stacey Contreras, female   DOB: [birth year], 59 y.o.   MRN: [id number]    HPI:  Stacey Contreras is a 59 y.o.   female referred by Stacey Contreras, * due to complaints of diarrhea.   [place] in her family relocated to [place] from [place] 6 years ago.Stacey Contreras states that she was evaluated by her PCP on May 20 after having had diarrhea for a week. Through that time, she was having 3 or 4 mushy to watery bowel movements on a daily basis. She had no bright red blood per rectum or melena. Her diarrhea was associated with waves of nausea. Prior to the onset have her diarrhea she had not had any biotics nor had she traveled outside of the country. She had not required any new pets. She had no associated I pain, joint pain, oral ulcers, or skin rashes. When seen by her PCP, stool samples were obtained and were negative. She states her diarrhea resolved within 2 days of seeing her PCP, and then her husband and sons got the diarrhea for a week as well, and they have since recovered. She thinks she may have had a "GI bug" that went through the family and they all feel better.  Stacey Contreras reports that she had a colonoscopy in [place] about 13 years ago. At that time, the colonoscopy was performed due to rectal bleeding. She says she was told she had internal hemorrhoids and was treated with suppositories and has not had problems since. She says she did not have any polyps and was told she could have a repeat colonoscopy in 10 years, however because of the move she has not yet done so. She would like to schedule that at this time. She denies a family history of colon cancer but states that both of her parents had colon polyps though she is not sure at what age. She has a maternal cousin with Crohn's and a knot who has chronic diarrhea, the etiology of which she is unsure.   Past Medical History  Diagnosis Date  . Hypothyroidism   . Depression   . Elevated lipids   . Arthritis   . Gallstones   . Thyroid  disease     Past Surgical History  Procedure Laterality Date  . Cesarean section    . Vaginal hysterectomy      Due to dysmenorrhea  . Appendectomy    . Cholecystectomy     Family History  Problem Relation Age of Onset  . Depression Father     attempted suicide  . Depression Sister     suidcide  . Hypertension Father   . Mitral valve prolapse Mother   . Hypothyroidism Mother   . Colon cancer Neg Hx   . Colon polyps Mother   . Colon polyps Father   . Kidney disease Neg Hx   . Gallbladder disease Neg Hx   . Diabetes Neg Hx   . Esophageal cancer Neg Hx    History  Substance Use Topics  . Smoking status: Never Smoker   . Smokeless tobacco: Never Used  . Alcohol Use: Yes     Comment: very rarely   Current Outpatient Prescriptions  Medication Sig Dispense Refill  . buPROPion (WELLBUTRIN XL) 150 MG 24 hr tablet Take 2 tablets (300 mg total) by mouth daily. 180 tablet 1  . Cholecalciferol (D 2000) 2000 UNITS TABS Take 2,000 Units by mouth daily.     . clobetasol cream (TEMOVATE) 0.05 % Apply 1 application topically  2 (two) times daily. 45 g 1  . Coenzyme Q10 ([employer]S COENZYME Q10) 200 MG capsule Take 200 mg by mouth daily.     . Lutein 6 MG CAPS Take 1 capsule by mouth daily.     . Multiple Vitamin (MULTIVITAMIN) capsule Take 1 capsule by mouth daily.     . Omega-3 Fatty Acids (OMEGA 3 500 PO) Take 1 tablet by mouth daily.     Marland Kitchen SYNTHROID 112 MCG tablet Take 1 tablet (112 mcg total) by mouth daily. 90 tablet 0  . MOVIPREP 100 G SOLR Take 1 kit (200 g total) by mouth as directed. 1 kit 0  . saccharomyces boulardii (FLORASTOR) 250 MG capsule Take 1 capsule (250 mg total) by mouth 2 (two) times daily. 60 capsule 0   No current facility-administered medications for this visit.   Allergies  Allergen Reactions  . Sulfonamide Derivatives   . Tamiflu [Oseltamivir Phosphate] Other (See Comments)    Irritabilty, mood change      Review of Systems: Gen: Denies any fever, chills,  sweats, anorexia, fatigue, weakness, malaise, weight loss, and sleep disorder CV: Denies chest pain, angina, palpitations, syncope, orthopnea, PND, peripheral edema, and claudication. Resp: Denies dyspnea at rest, dyspnea with exercise, cough, sputum, wheezing, coughing up blood, and pleurisy. GI: Denies vomiting blood, jaundice, and fecal incontinence.   Denies dysphagia or odynophagia. GU : Denies urinary burning, blood in urine, urinary frequency, urinary hesitancy, nocturnal urination, and urinary incontinence. MS: Denies joint pain, limitation of movement, and swelling, stiffness, low back pain, extremity pain. Denies muscle weakness, cramps, atrophy.  Derm: Denies rash, itching, dry skin, hives, moles, warts, or unhealing ulcers.  Psych: Denies depression, anxiety, memory loss, suicidal ideation, hallucinations, paranoia, and confusion. Heme: Denies bruising, bleeding, and enlarged lymph nodes. Neuro:  Denies any headaches, dizziness, paresthesias. Endo:  Denies any problems with DM, thyroid, adrenal function   LAB RESULTS: Stool culture from 01/03/2015 had no salmonella, shigella, Campylobacter, Yersinia, or Escherichia coli isolated. CBC on 01/03/2015 white count 4.4, hemoglobin 15.3, hematocrit 44.9, platelets 339,000, MCV 93.  Physical Exam: BP 128/74 mmHg  Pulse 88  Ht 5' 3.75" (1.619 m)  Wt 149 lb (67.586 kg)  BMI 25.78 kg/m2  LMP 08/16/1998 Constitutional: Pleasant,well-developed female in no acute distress. HEENT: Normocephalic and atraumatic. Conjunctivae are normal. No scleral icterus. Neck supple. No thyromegaly Cardiovascular: Normal rate, regular rhythm.  Pulmonary/chest: Effort normal and breath sounds normal. No wheezing, rales or rhonchi. Abdominal: Soft, nondistended, nontender. Bowel sounds active throughout. There are no masses palpable. No hepatomegaly. Extremities: no edema Lymphadenopathy: No cervical adenopathy noted. Neurological: Alert and oriented to  person place and time. Skin: Skin is warm and dry. No rashes noted. Psychiatric: Normal mood and affect. Behavior is normal.  ASSESSMENT AND PLAN: 59 year old female status post a recent bout of diarrhea that lasted for 8 or 9 days, here for evaluation. Her diarrhea has resolved and was likely infectious in nature as it went on to affect her other family members who have subsequently all recovered as well. She is however due for surveillance colonoscopy to evaluate for polyps or neoplasia.The risks, benefits, and alternatives to colonoscopy with possible biopsy and possible polypectomy were discussed with the patient and they consent to proceed.  The procedure will be scheduled with Dr. Marina Goodell.  Further recommendations will be made pending the findings of the above.    Aura Bibby, Tollie Pizza PA-C 01/23/2015, 12:30 PM  CC: Stacey Contreras, *

## 2015-01-23 NOTE — Progress Notes (Signed)
Agree with initial assessment and plans 

## 2015-01-31 ENCOUNTER — Ambulatory Visit (INDEPENDENT_AMBULATORY_CARE_PROVIDER_SITE_OTHER): Payer: 59 | Admitting: Family Medicine

## 2015-01-31 ENCOUNTER — Encounter: Payer: Self-pay | Admitting: Family Medicine

## 2015-01-31 VITALS — BP 125/64 | HR 85 | Ht 63.75 in | Wt 149.0 lb

## 2015-01-31 DIAGNOSIS — L03019 Cellulitis of unspecified finger: Secondary | ICD-10-CM

## 2015-01-31 DIAGNOSIS — IMO0002 Reserved for concepts with insufficient information to code with codable children: Secondary | ICD-10-CM

## 2015-01-31 MED ORDER — CLINDAMYCIN HCL 300 MG PO CAPS: 300.0000 mg | ORAL_CAPSULE | Freq: Three times a day (TID) | ORAL | Status: DC

## 2015-01-31 NOTE — Progress Notes (Signed)
CC: Stacey Contreras is a 59 y.o. female is here for Hand Pain   Subjective: HPI:  Right ring finger pain and swelling at the nail base that has been present since last week. It began after she dropped a trashcan lid on her finger at this site. Interventions have included soaking in alcohol. She's been able to express a little bit of blood from the wound but there's been no pus. She states she feels normal other than the pain. Denies fevers, chills, nausea, joint pain. Pain is mild in severity.   Review Of Systems Outlined In HPI  Past Medical History  Diagnosis Date  . Hypothyroidism   . Depression   . Elevated lipids   . Arthritis   . Gallstones   . Thyroid disease     Past Surgical History  Procedure Laterality Date  . Cesarean section    . Vaginal hysterectomy      Due to dysmenorrhea  . Appendectomy    . Cholecystectomy     Family History  Problem Relation Age of Onset  . Depression Father     attempted suicide  . Depression Sister     suidcide  . Hypertension Father   . Mitral valve prolapse Mother   . Hypothyroidism Mother   . Colon cancer Neg Hx   . Colon polyps Mother   . Colon polyps Father   . Kidney disease Neg Hx   . Gallbladder disease Neg Hx   . Diabetes Neg Hx   . Esophageal cancer Neg Hx     History   Social History  . Marital Status: Married    Spouse Name: N/A  . Number of Children: 2  . Years of Education: N/A   Occupational History  . Business entrepreneur    Social History Main Topics  . Smoking status: Never Smoker   . Smokeless tobacco: Never Used  . Alcohol Use: Yes     Comment: very rarely  . Drug Use: No  . Sexual Activity:    Partners: Male   Other Topics Concern  . Not on file   Social History Narrative     Objective: BP 125/64 mmHg  Pulse 85  Ht 5' 3.75" (1.619 m)  Wt 149 lb (67.586 kg)  BMI 25.78 kg/m2  LMP 08/16/1998  Vital signs reviewed. General: Alert and Oriented, No Acute Distress HEENT: Pupils equal,  round, reactive to light. Conjunctivae clear.  External ears unremarkable.  Moist mucous membranes. Lungs: Clear and comfortable work of breathing, speaking in full sentences without accessory muscle use. Cardiac: Regular rate and rhythm.  Neuro: CN II-XII grossly intact, gait normal. Extremities: No peripheral edema.  Strong peripheral pulses.  Mental Status: No depression, anxiety, nor agitation. Logical though process. Skin: Warm and dry. Mild swelling and warmth extending from the distal nail fold to the proximal nail plate on the medial aspect of the right ring finger. Slightly tender to the touch.   Assessment & Plan: Zollie was seen today for hand pain.  Diagnoses and all orders for this visit:  Paronychia, unspecified laterality Orders: -     clindamycin (CLEOCIN) 300 MG capsule; Take 1 capsule (300 mg total) by mouth 3 (three) times daily.   Paronychia: It does not appear that there is any pus that's collecting in this wound plus she tells me she is re: stab it a few times at home with only getting blood to come out of it. Discussed Epsom salt baths 4 times a day for 15  minutes over the weekend followed by milking the wound to help express any new pus or pulling blood. Start clindamycin.  Return if symptoms worsen or fail to improve.

## 2015-03-18 ENCOUNTER — Encounter: Payer: Self-pay | Admitting: Internal Medicine

## 2015-03-18 ENCOUNTER — Ambulatory Visit (AMBULATORY_SURGERY_CENTER): Payer: 59 | Admitting: Internal Medicine

## 2015-03-18 VITALS — BP 136/75 | HR 84 | Temp 98.4°F | Resp 28 | Ht 63.75 in | Wt 149.0 lb

## 2015-03-18 DIAGNOSIS — Z1211 Encounter for screening for malignant neoplasm of colon: Secondary | ICD-10-CM | POA: Diagnosis not present

## 2015-03-18 DIAGNOSIS — D120 Benign neoplasm of cecum: Secondary | ICD-10-CM

## 2015-03-18 MED ORDER — SODIUM CHLORIDE 0.9 % IV SOLN: 500.0000 mL | INTRAVENOUS | Status: DC

## 2015-03-18 NOTE — Op Note (Signed)
[facility]  [address] [place], [address]   COLONOSCOPY PROCEDURE REPORT  PATIENT: Stacey Contreras, Stacey Contreras  MR#: [id number] BIRTHDATE: 1956-05-02 , 22  yrs. old GENDER: female ENDOSCOPIST: Eustace Quail, MD REFERRED [id number] Madilyn Fireman, M.D. PROCEDURE DATE:  03/18/2015 PROCEDURE:   Colonoscopy, screening and Colonoscopy with snare polypectomy x 2 First Screening Colonoscopy - Avg.  risk and is 50 yrs.  old or older Yes.  Prior Negative Screening - Now for repeat screening. N/A  History of Adenoma - Now for follow-up colonoscopy & has been > or = to 3 yrs.  N/A  Polyps removed today? Yes ASA CLASS:   Class I INDICATIONS:Screening for colonic neoplasia and Colorectal Neoplasm Risk Assessment for this procedure is average risk.  Marland Kitchen NOTE: The patient had colonoscopy in [place] approximately 13 years ago to evaluate rectal bleeding. No abnormalities reported. Recommended to have follow-up in 10 years for routine screening MEDICATIONS: Monitored anesthesia care and Propofol 300 mg IV  DESCRIPTION OF PROCEDURE:   After the risks benefits and alternatives of the procedure were thoroughly explained, informed consent was obtained.  The digital rectal exam revealed no abnormalities of the rectum.   The LB [id number]  endoscope was introduced through the anus and advanced to the cecum, which was identified by both the appendix and ileocecal valve. No adverse events experienced.   The quality of the prep was excellent. (MoviPrep was used)  The instrument was then slowly withdrawn as the colon was fully examined. Estimated blood loss is zero unless otherwise noted in this procedure report.    COLON FINDINGS: Two polyps (2 mm and 6 mm sessile) were found at the cecum.  A polypectomy was performed with a cold snare.  The resection was complete, the polyp tissue was completely retrieved and sent to histology.   The examination was otherwise normal. Retroflexed views  revealed internal hemorrhoids. The time to cecum = 4.2 Withdrawal time = 14.9   The scope was withdrawn and the procedure completed. COMPLICATIONS: There were no immediate complications.  ENDOSCOPIC IMPRESSION: 1.   Two polyps were found at the cecum; polypectomy was performed with a cold snare 2.   The examination was otherwise normal  RECOMMENDATIONS: 1. Repeat colonoscopy in 5 years if polyp adenomatous; otherwise 10 years  eSigned:  Eustace Quail, MD 03/18/2015 1:58 PM   cc: The Patient and Beatrice Lecher, MD

## 2015-03-18 NOTE — Patient Instructions (Signed)
Discharge instructions given. Handout on polyps. Resume previous medications. YOU HAD AN ENDOSCOPIC PROCEDURE TODAY AT THE [facility]:   Refer to the procedure report that was given to you for any specific questions about what was found during the examination.  If the procedure report does not answer your questions, please call your gastroenterologist to clarify.  If you requested that your care partner not be given the details of your procedure findings, then the procedure report has been included in a sealed envelope for you to review at your convenience later.  YOU SHOULD EXPECT: Some feelings of bloating in the abdomen. Passage of more gas than usual.  Walking can help get rid of the air that was put into your GI tract during the procedure and reduce the bloating. If you had a lower endoscopy (such as a colonoscopy or flexible sigmoidoscopy) you may notice spotting of blood in your stool or on the toilet paper. If you underwent a bowel prep for your procedure, you may not have a normal bowel movement for a few days.  Please Note:  You might notice some irritation and congestion in your nose or some drainage.  This is from the oxygen used during your procedure.  There is no need for concern and it should clear up in a day or so.  SYMPTOMS TO REPORT IMMEDIATELY:   Following lower endoscopy (colonoscopy or flexible sigmoidoscopy):  Excessive amounts of blood in the stool  Significant tenderness or worsening of abdominal pains  Swelling of the abdomen that is new, acute  Fever of 100F or higher   For urgent or emergent issues, a gastroenterologist can be reached at any hour by calling [phone].   DIET: Your first meal following the procedure should be a small meal and then it is ok to progress to your normal diet. Heavy or fried foods are harder to digest and may make you feel nauseous or bloated.  Likewise, meals heavy in dairy and vegetables can increase bloating.  Drink  plenty of fluids but you should avoid alcoholic beverages for 24 hours.  ACTIVITY:  You should plan to take it easy for the rest of today and you should NOT DRIVE or use heavy machinery until tomorrow (because of the sedation medicines used during the test).    FOLLOW UP: Our staff will call the number listed on your records the next business day following your procedure to check on you and address any questions or concerns that you may have regarding the information given to you following your procedure. If we do not reach you, we will leave a message.  However, if you are feeling well and you are not experiencing any problems, there is no need to return our call.  We will assume that you have returned to your regular daily activities without incident.  If any biopsies were taken you will be contacted by phone or by letter within the next 1-3 weeks.  Please call us at [phone] if you have not heard about the biopsies in 3 weeks.    SIGNATURES/CONFIDENTIALITY: You and/or your care partner have signed paperwork which will be entered into your electronic medical record.  These signatures attest to the fact that that the information above on your After Visit Summary has been reviewed and is understood.  Full responsibility of the confidentiality of this discharge information lies with you and/or your care-partner. 

## 2015-03-18 NOTE — Progress Notes (Signed)
Called to room to assist during endoscopic procedure.  Patient ID and intended procedure confirmed with present staff. Received instructions for my participation in the procedure from the performing physician.  

## 2015-03-18 NOTE — Progress Notes (Signed)
Transferred to recovery room. A/O x3, pleased with MAC.  VSS.  Report to [place], Therapist, sports.

## 2015-03-19 ENCOUNTER — Telehealth: Payer: Self-pay | Admitting: *Deleted

## 2015-03-19 NOTE — Telephone Encounter (Signed)
  Follow up Call-  Call back number 03/18/2015  Post procedure Call Back phone  # [phone]  Permission to leave phone message Yes     Patient questions:  Do you have a fever, pain , or abdominal swelling? No. Pain Score  0 *  Have you tolerated food without any problems? Yes.    Have you been able to return to your normal activities? Yes.    Do you have any questions about your discharge instructions: Diet   No. Medications  No. Follow up visit  No.  Do you have questions or concerns about your Care? No.  Actions: * If pain score is 4 or above: No action needed, pain <4.

## 2015-03-29 ENCOUNTER — Other Ambulatory Visit: Payer: Self-pay | Admitting: Family Medicine

## 2015-04-01 ENCOUNTER — Encounter: Payer: Self-pay | Admitting: Internal Medicine

## 2015-05-02 ENCOUNTER — Other Ambulatory Visit: Payer: Self-pay | Admitting: Family Medicine

## 2015-06-13 ENCOUNTER — Other Ambulatory Visit: Payer: Self-pay | Admitting: Family Medicine

## 2015-06-13 DIAGNOSIS — Z1231 Encounter for screening mammogram for malignant neoplasm of breast: Secondary | ICD-10-CM

## 2015-06-25 ENCOUNTER — Other Ambulatory Visit: Payer: Self-pay | Admitting: Family Medicine

## 2015-07-17 ENCOUNTER — Ambulatory Visit (INDEPENDENT_AMBULATORY_CARE_PROVIDER_SITE_OTHER): Payer: 59

## 2015-07-17 DIAGNOSIS — Z1231 Encounter for screening mammogram for malignant neoplasm of breast: Secondary | ICD-10-CM | POA: Diagnosis not present

## 2015-09-26 ENCOUNTER — Encounter: Payer: 59 | Admitting: Family Medicine

## 2015-09-29 ENCOUNTER — Other Ambulatory Visit: Payer: Self-pay | Admitting: Family Medicine

## 2015-10-01 ENCOUNTER — Telehealth: Payer: Self-pay | Admitting: Family Medicine

## 2015-10-01 NOTE — Telephone Encounter (Signed)
Informed patient she is due for a follow up appt on her mood with Dr. Madilyn Fireman in order to get her med refills. She states she will call back and schedule appt time

## 2015-10-13 ENCOUNTER — Encounter: Payer: Self-pay | Admitting: Family Medicine

## 2015-10-13 ENCOUNTER — Ambulatory Visit (INDEPENDENT_AMBULATORY_CARE_PROVIDER_SITE_OTHER): Payer: 59 | Admitting: Family Medicine

## 2015-10-13 VITALS — BP 132/62 | HR 88 | Wt 149.0 lb

## 2015-10-13 DIAGNOSIS — Z1159 Encounter for screening for other viral diseases: Secondary | ICD-10-CM | POA: Diagnosis not present

## 2015-10-13 DIAGNOSIS — Z114 Encounter for screening for human immunodeficiency virus [HIV]: Secondary | ICD-10-CM | POA: Diagnosis not present

## 2015-10-13 DIAGNOSIS — E039 Hypothyroidism, unspecified: Secondary | ICD-10-CM | POA: Diagnosis not present

## 2015-10-13 DIAGNOSIS — Z0000 Encounter for general adult medical examination without abnormal findings: Secondary | ICD-10-CM

## 2015-10-13 MED ORDER — SYNTHROID 112 MCG PO TABS: 112.0000 ug | ORAL_TABLET | Freq: Every day | ORAL | Status: AC

## 2015-10-13 MED ORDER — BUPROPION HCL ER (XL) 150 MG PO TB24: ORAL_TABLET | ORAL | Status: DC

## 2015-10-13 NOTE — Progress Notes (Signed)
  Subjective:     Stacey Contreras is a 60 y.o. female and is here for a comprehensive physical exam. The patient reports no problems.She has been more stress recently. Her father passed away in 02-Sep-2022 from lung cancer and her mother who has early dementia is now living with her.  Social History   Social History  . Marital Status: Married    Spouse Name: N/A  . Number of Children: 2  . Years of Education: N/A   Occupational History  . Business entrepreneur    Social History Main Topics  . Smoking status: Never Smoker   . Smokeless tobacco: Never Used  . Alcohol Use: Yes     Comment: very rarely  . Drug Use: No  . Sexual Activity:    Partners: Male   Other Topics Concern  . Not on file   Social History Narrative   Health Maintenance  Topic Date Due  . Hepatitis C Screening  [birth year]  . HIV Screening  09/15/1971  . PAP SMEAR  01/05/2015  . INFLUENZA VACCINE  03/16/2016  . MAMMOGRAM  07/16/2017  . COLONOSCOPY  03/17/2020  . TETANUS/TDAP  12/19/2021    The following portions of the patient's history were reviewed and updated as appropriate: allergies, current medications, past family history, past medical history, past social history, past surgical history and problem list.  Review of Systems A comprehensive review of systems was negative.   Objective:    BP 132/62 mmHg  Pulse 88  Wt 149 lb (67.586 kg)  SpO2 98%  LMP 08/16/1998 General appearance: alert, cooperative and appears stated age Head: Normocephalic, without obvious abnormality, atraumatic Eyes: conj clear, EOMI, PEERLA Ears: normal TM's and external ear canals both ears Nose: Nares normal. Septum midline. Mucosa normal. No drainage or sinus tenderness. Throat: lips, mucosa, and tongue normal; teeth and gums normal Neck: no adenopathy, no carotid bruit, no JVD, supple, symmetrical, trachea midline and thyroid not enlarged, symmetric, no tenderness/mass/nodules Back: symmetric, no curvature. ROM normal. No  CVA tenderness. Lungs: clear to auscultation bilaterally Heart: regular rate and rhythm, S1, S2 normal, no murmur, click, rub or gallop Abdomen: soft, non-tender; bowel sounds normal; no masses,  no organomegaly Extremities: extremities normal, atraumatic, no cyanosis or edema Pulses: 2+ and symmetric Skin: Skin color, texture, turgor normal. No rashes or lesions Lymph nodes: Cervical adenopathy: nl and Axillary adenopathy: nl Neurologic: Alert and oriented X 3, normal strength and tone. Normal symmetric reflexes. Normal coordination and gait    Assessment:    Healthy female exam.     Plan:     See After Visit Summary for Counseling Recommendations  Keep up a regular exercise program and make sure you are eating a healthy diet Try to eat 4 servings of dairy a day, or if you are lactose intolerant take a calcium with vitamin D daily.  Your vaccines are up to date.  Mammogram is up-to-date. Pap smear needed because of hysterectomy. Discussed Hep C and HIV screening.

## 2015-10-14 LAB — HEPATITIS C ANTIBODY: HCV Ab: NEGATIVE

## 2015-10-14 LAB — COMPLETE METABOLIC PANEL WITH GFR
ALT: 18 U/L (ref 6–29)
AST: 19 U/L (ref 10–35)
Albumin: 4.1 g/dL (ref 3.6–5.1)
Alkaline Phosphatase: 74 U/L (ref 33–130)
BUN: 14 mg/dL (ref 7–25)
CO2: 27 mmol/L (ref 20–31)
Calcium: 9 mg/dL (ref 8.6–10.4)
Chloride: 105 mmol/L (ref 98–110)
Creat: 0.94 mg/dL (ref 0.50–1.05)
GFR, Est African American: 77 mL/min (ref >=60)
GFR, Est Non African American: 67 mL/min (ref >=60)
Glucose, Bld: 91 mg/dL (ref 65–99)
Potassium: 4.3 mmol/L (ref 3.5–5.3)
Sodium: 140 mmol/L (ref 135–146)
Total Bilirubin: 0.6 mg/dL (ref 0.2–1.2)
Total Protein: 6.8 g/dL (ref 6.1–8.1)

## 2015-10-14 LAB — LIPID PANEL
Cholesterol: 227 mg/dL — ABNORMAL HIGH (ref 125–200)
HDL: 63 mg/dL (ref >=46)
LDL Cholesterol: 144 mg/dL — ABNORMAL HIGH (ref <130)
Total CHOL/HDL Ratio: 3.6 Ratio (ref <=5.0)
Triglycerides: 102 mg/dL (ref <150)
VLDL: 20 mg/dL (ref <30)

## 2015-10-14 LAB — TSH: TSH: 1.14 mIU/L

## 2015-10-15 LAB — HIV ANTIBODY (ROUTINE TESTING W REFLEX): HIV 1&2 Ab, 4th Generation: NONREACTIVE

## 2015-11-13 ENCOUNTER — Other Ambulatory Visit: Payer: Self-pay | Admitting: *Deleted

## 2015-11-13 MED ORDER — BUPROPION HCL ER (XL) 150 MG PO TB24: ORAL_TABLET | ORAL | Status: DC

## 2016-06-29 ENCOUNTER — Encounter: Payer: Self-pay | Admitting: Family Medicine

## 2016-06-29 ENCOUNTER — Ambulatory Visit (INDEPENDENT_AMBULATORY_CARE_PROVIDER_SITE_OTHER): Payer: 59 | Admitting: Family Medicine

## 2016-06-29 VITALS — BP 125/63 | HR 86 | Temp 99.5°F | Ht 64.0 in | Wt 149.0 lb

## 2016-06-29 DIAGNOSIS — J018 Other acute sinusitis: Secondary | ICD-10-CM

## 2016-06-29 DIAGNOSIS — Z23 Encounter for immunization: Secondary | ICD-10-CM | POA: Diagnosis not present

## 2016-06-29 MED ORDER — AZITHROMYCIN 250 MG PO TABS: ORAL_TABLET | ORAL | 0 refills | Status: AC

## 2016-06-29 MED ORDER — ALBUTEROL SULFATE (2.5 MG/3ML) 0.083% IN NEBU
2.5000 mg | INHALATION_SOLUTION | Freq: Once | RESPIRATORY_TRACT | Status: AC
Start: 2016-06-29 — End: 2016-06-29
  Administered 2016-06-29: 2.5 mg via RESPIRATORY_TRACT

## 2016-06-29 NOTE — Progress Notes (Signed)
   Subjective:    Patient ID: Stacey Contreras, female    DOB: [birth year], 60 y.o.   MRN: [id number]  HPI x2wks pt reports that her chest feels like its burning, denies cough, she has been taking aleve sinus/allergy she has had some green nasal mucus this AM, using saline rinse to irrigate her nose. she stated that she experiences nausea in the middle of the night has not had any vomiting. Still having some low grade temps.  No wheezing.    Review of Systems     Objective:   Physical Exam  Constitutional: She is oriented to person, place, and time. She appears well-developed and well-nourished.  HENT:  Head: Normocephalic and atraumatic.  Right Ear: External ear normal.  Left Ear: External ear normal.  Nose: Nose normal.  Mouth/Throat: Oropharynx is clear and moist.  TMs and canals are clear.   Eyes: Conjunctivae and EOM are normal. Pupils are equal, round, and reactive to light.  Neck: Neck supple. No thyromegaly present.  Cardiovascular: Normal rate, regular rhythm and normal heart sounds.   Pulmonary/Chest: Effort normal and breath sounds normal. She has no wheezes.  Lymphadenopathy:    She has no cervical adenopathy.  Neurological: She is alert and oriented to person, place, and time.  Skin: Skin is warm and dry.  Psychiatric: She has a normal mood and affect.      Assessment & Plan:  Acute sinusitis - will treat with zpack.  Since she is having some chest burning and discomfort I asked her to call back if she feels like she started to become more short of breath this week and we can always get a chest x-ray. She did blow in the yellow zone for peak flow so we did give her an albuterol treatment here in the office but she really did not have any significant improvement.  Flu vaccine given today.

## 2016-06-29 NOTE — Patient Instructions (Addendum)
Call if not better in one week.   Hydrate well.  If you become more short of breath then call us and we can get a chest xray later this week if you like.

## 2016-07-01 ENCOUNTER — Other Ambulatory Visit: Payer: Self-pay | Admitting: Family Medicine

## 2016-07-01 DIAGNOSIS — Z1239 Encounter for other screening for malignant neoplasm of breast: Secondary | ICD-10-CM

## 2016-07-15 ENCOUNTER — Other Ambulatory Visit: Payer: Self-pay | Admitting: Family Medicine

## 2016-07-15 ENCOUNTER — Telehealth: Payer: Self-pay

## 2016-07-15 DIAGNOSIS — R0602 Shortness of breath: Secondary | ICD-10-CM

## 2016-07-15 NOTE — Progress Notes (Signed)
Pt.notified

## 2016-07-15 NOTE — Telephone Encounter (Signed)
Ordered placed

## 2016-07-15 NOTE — Telephone Encounter (Signed)
Pt reports that after finishing antibiotic her lungs began to burn with SOB.  I seen in the note that you want to order a chest x-ray. What is the Dx? Do you have any other recommendations.

## 2016-07-15 NOTE — Telephone Encounter (Signed)
Can use SOB code. Thank you

## 2016-07-16 ENCOUNTER — Ambulatory Visit (INDEPENDENT_AMBULATORY_CARE_PROVIDER_SITE_OTHER): Payer: 59

## 2016-07-16 DIAGNOSIS — R208 Other disturbances of skin sensation: Secondary | ICD-10-CM | POA: Diagnosis not present

## 2016-07-16 DIAGNOSIS — R0602 Shortness of breath: Secondary | ICD-10-CM

## 2016-07-20 ENCOUNTER — Ambulatory Visit (INDEPENDENT_AMBULATORY_CARE_PROVIDER_SITE_OTHER): Payer: 59

## 2016-07-20 DIAGNOSIS — Z1239 Encounter for other screening for malignant neoplasm of breast: Secondary | ICD-10-CM

## 2016-07-20 DIAGNOSIS — Z1231 Encounter for screening mammogram for malignant neoplasm of breast: Secondary | ICD-10-CM

## 2016-11-09 LAB — TSH: TSH: 0.77 u[IU]/mL (ref 0.41–5.90)

## 2016-11-10 ENCOUNTER — Encounter: Payer: Self-pay | Admitting: Family Medicine

## 2016-11-18 NOTE — Progress Notes (Signed)
All labs are normal. 

## 2016-11-30 ENCOUNTER — Ambulatory Visit (INDEPENDENT_AMBULATORY_CARE_PROVIDER_SITE_OTHER): Payer: 59 | Admitting: Family Medicine

## 2016-11-30 ENCOUNTER — Ambulatory Visit (HOSPITAL_BASED_OUTPATIENT_CLINIC_OR_DEPARTMENT_OTHER)
Admission: RE | Admit: 2016-11-30 | Discharge: 2016-11-30 | Disposition: A | Discharge status: Home / Self Care | Payer: 59 | Source: Ambulatory Visit | Attending: Family Medicine | Admitting: Family Medicine

## 2016-11-30 ENCOUNTER — Encounter: Payer: Self-pay | Admitting: Family Medicine

## 2016-11-30 VITALS — BP 124/69 | HR 92 | Ht 64.0 in | Wt 147.0 lb

## 2016-11-30 DIAGNOSIS — M79661 Pain in right lower leg: Secondary | ICD-10-CM | POA: Diagnosis not present

## 2016-11-30 NOTE — Progress Notes (Signed)
Subjective:    Patient ID: Stacey Contreras, female    DOB: 10-13-1955, 61 y.o.   MRN: [id number]  HPI  R calf pain x 2 wks she reports no trauma or injury, she stated that the pain started in her calf and moved up to behind her knee. denies any SOB she stated that she feels pressure when walking and her pain is 2/10.  He denies any known injury or trauma. She has been doing a lot of lifting and moving as they are getting ready to move from their house. She says she's never really had anything like this before. She does have a few varicose veins but the discomfort is really at the base of the in the right lower leg. She has not noticed any swelling in the legs or ankles. No shortness of breath and no chest pain. She's never had a DVT. She denies any recent prolonged sitting or travel. She does not take hormone therapy.  Review of Systems  BP 124/69   Pulse 92   Ht 5\' 4"  (1.626 m)   Wt 147 lb (66.7 kg)   LMP 08/16/1998   SpO2 98%   BMI 25.23 kg/m     Allergies  Allergen Reactions  . Sulfonamide Derivatives   . Tamiflu [Oseltamivir Phosphate] Other (See Comments)    Irritabilty, mood change     Past Medical History:  Diagnosis Date  . Arthritis   . Depression   . Elevated lipids   . Gallstones   . Hypothyroidism   . Thyroid disease     Past Surgical History:  Procedure Laterality Date  . APPENDECTOMY    . CESAREAN SECTION    . CHOLECYSTECTOMY    . VAGINAL HYSTERECTOMY     Due to dysmenorrhea    Social History   Social History  . Marital status: Married    Spouse name: N/A  . Number of children: 2  . Years of education: N/A   Occupational History  . Business entrepreneur    Social History Main Topics  . Smoking status: Never Smoker  . Smokeless tobacco: Never Used  . Alcohol use Yes     Comment: very rarely  . Drug use: No  . Sexual activity: Yes    Partners: Male   Other Topics Concern  . Not on file   Social History Narrative   She is working our  regularly.      Family History  Problem Relation Age of Onset  . Depression Father     attempted suicide  . Depression Sister     suidcide  . Hypertension Father   . Mitral valve prolapse Mother   . Hypothyroidism Mother   . Colon cancer Neg Hx   . Colon polyps Mother   . Colon polyps Father   . Kidney disease Neg Hx   . Gallbladder disease Neg Hx   . Diabetes Neg Hx   . Esophageal cancer Neg Hx   . Lung cancer Father     Outpatient Encounter Prescriptions as of 11/30/2016  Medication Sig  . buPROPion (WELLBUTRIN XL) 150 MG 24 hr tablet Take 2 tablets daily (300 mg)  . Cholecalciferol (D 2000) 2000 UNITS TABS Take 2,000 Units by mouth daily.   . Coenzyme Q10 (CVS COENZYME Q10) 200 MG capsule Take 200 mg by mouth daily.   . Lutein 6 MG CAPS Take 1 capsule by mouth daily.   . Multiple Vitamin (MULTIVITAMIN) capsule Take 1 capsule by mouth daily.   Marland Kitchen  Omega-3 Fatty Acids (OMEGA 3 500 PO) Take 1 tablet by mouth daily.   Marland Kitchen SYNTHROID 112 MCG tablet Take 1 tablet (112 mcg total) by mouth daily.  . [DISCONTINUED] clobetasol cream (TEMOVATE) 9.37 % Apply 1 application topically 2 (two) times daily.   No facility-administered encounter medications on file as of 11/30/2016.           Objective:   Physical Exam  Constitutional: She is oriented to person, place, and time. She appears well-developed and well-nourished.  HENT:  Head: Normocephalic and atraumatic.  Eyes: Conjunctivae and EOM are normal.  Cardiovascular: Normal rate.   Pulmonary/Chest: Effort normal.  Musculoskeletal:  Right calf is just mildly tender at the base. No swelling. Each calf measured 37-1/2 cm. No swelling or erythema. No edema around the ankles. Right foot with normal flexion and extension and strength is 5 out of 5.  Neurological: She is alert and oriented to person, place, and time.  Skin: Skin is dry. No pallor.  Psychiatric: She has a normal mood and affect. Her behavior is normal.  Vitals  reviewed.      Assessment & Plan:  Right calf pain - Recommend Doppler to rule out DVT. She does have some discomfort but no swelling. It's been going along on long enough I think we should check this out. She really is overall low risk as far as recent travel or being on hormone therapy etc. which would increase her risk.

## 2016-12-27 ENCOUNTER — Other Ambulatory Visit: Payer: Self-pay | Admitting: Family Medicine

## 2017-01-10 ENCOUNTER — Emergency Department (HOSPITAL_BASED_OUTPATIENT_CLINIC_OR_DEPARTMENT_OTHER): Payer: 59

## 2017-01-10 ENCOUNTER — Emergency Department (HOSPITAL_BASED_OUTPATIENT_CLINIC_OR_DEPARTMENT_OTHER)
Admission: EM | Admit: 2017-01-10 | Discharge: 2017-01-10 | Disposition: A | Discharge status: Home / Self Care | Payer: 59 | Attending: Emergency Medicine | Admitting: Emergency Medicine

## 2017-01-10 ENCOUNTER — Encounter (HOSPITAL_BASED_OUTPATIENT_CLINIC_OR_DEPARTMENT_OTHER): Payer: Self-pay | Admitting: *Deleted

## 2017-01-10 DIAGNOSIS — Y9301 Activity, walking, marching and hiking: Secondary | ICD-10-CM | POA: Diagnosis not present

## 2017-01-10 DIAGNOSIS — E039 Hypothyroidism, unspecified: Secondary | ICD-10-CM | POA: Insufficient documentation

## 2017-01-10 DIAGNOSIS — Y999 Unspecified external cause status: Secondary | ICD-10-CM | POA: Insufficient documentation

## 2017-01-10 DIAGNOSIS — M25561 Pain in right knee: Secondary | ICD-10-CM | POA: Diagnosis not present

## 2017-01-10 DIAGNOSIS — W228XXA Striking against or struck by other objects, initial encounter: Secondary | ICD-10-CM | POA: Insufficient documentation

## 2017-01-10 DIAGNOSIS — S8991XA Unspecified injury of right lower leg, initial encounter: Secondary | ICD-10-CM | POA: Diagnosis present

## 2017-01-10 DIAGNOSIS — Y929 Unspecified place or not applicable: Secondary | ICD-10-CM | POA: Insufficient documentation

## 2017-01-10 MED ORDER — IBUPROFEN 600 MG PO TABS: 600.0000 mg | ORAL_TABLET | Freq: Four times a day (QID) | ORAL | 0 refills | Status: AC | PRN

## 2017-01-10 NOTE — ED Triage Notes (Signed)
Pt c/o right knee injury stepping up step and felt a "tearing" x 4 hrs

## 2017-01-10 NOTE — ED Notes (Signed)
Patient c/o of pain to popliteal area.  Patient placed a soft knee brace from home prior to arrival and claimed that helps her to ambulate with a cane.

## 2017-01-10 NOTE — ED Notes (Signed)
Patient transported to X-ray 

## 2017-01-10 NOTE — Discharge Instructions (Signed)
Please read and follow all provided instructions.  Your diagnoses today include:  1. Acute pain of right knee     Tests performed today include: Vital signs. See below for your results today.   Medications prescribed:  Take as prescribed   Home care instructions:  Follow any educational materials contained in this packet.  Follow-up instructions: Please follow-up with Orthopedics for further evaluation of symptoms and treatment   Return instructions:  Please return to the Emergency Department if you do not get better, if you get worse, or new symptoms OR  - Fever (temperature greater than 101.43F)  - Bleeding that does not stop with holding pressure to the area    -Severe pain (please note that you may be more sore the day after your accident)  - Chest Pain  - Difficulty breathing  - Severe nausea or vomiting  - Inability to tolerate food and liquids  - Passing out  - Skin becoming red around your wounds  - Change in mental status (confusion or lethargy)  - New numbness or weakness    Please return if you have any other emergent concerns.  Additional Information:  Your vital signs today were: BP (!) 148/75    Pulse 86    Temp 98.5 F (36.9 C) (Oral)    Resp 16    Ht 5\' 4"  (1.626 m)    Wt 64.4 kg (142 lb)    LMP 08/16/1998    SpO2 99%    BMI 24.37 kg/m  If your blood pressure (BP) was elevated above 135/85 this visit, please have this repeated by your doctor within one month. ---------------

## 2017-01-10 NOTE — ED Provider Notes (Signed)
[facility] Provider Note   CSN: [id number] Arrival date & time: 01/10/17  1750  By signing my name below, I, Ny'Kea Lewis, attest that this documentation has been prepared under the direction and in the presence of Shary Decamp, PA-C. Electronically Signed: Lise Auer, ED Scribe. 01/10/17. 6:45 PM.   History   Chief Complaint Chief Complaint  Patient presents with  . Knee Injury   HPI  HPI Comments: Stacey Contreras is a 61 y.o. female  who presents to the Emergency Department complaining of right knee pain that started earlier today. She notes she was walking up steps when she felt a "tearing" sensation to her right knee. Pt reports pain with flexion/ extension and when apply pressure. No pain with leg relaxed. PTA she took Aleve with moderate relief. She also iced and compressed the area with mild relief.  No acute associated symptoms noted at this time.   Past Medical History:  Diagnosis Date  . Arthritis   . Depression   . Elevated lipids   . Gallstones   . Hypothyroidism   . Thyroid disease     Patient Active Problem List   Diagnosis Date Noted  . Numbness of both arms 08/21/2012  . Bilateral pain in feet at metacarpophalangeal joints. 07/04/2012  . Degenerative arthritis of carpometacarpal joint of left thumb 07/04/2012  . POLYARTHRITIS 07/24/2010  . HAND PAIN 07/24/2010  . APPENDECTOMY, HX OF 07/24/2010  . Hypothyroidism 10/02/2009  . Depression 10/02/2009  . MENOPAUSAL SYNDROME 10/02/2009    Past Surgical History:  Procedure Laterality Date  . APPENDECTOMY    . CESAREAN SECTION    . CHOLECYSTECTOMY    . VAGINAL HYSTERECTOMY     Due to dysmenorrhea    OB History    Gravida Para Term Preterm AB Living   6 1     5      SAB TAB Ectopic Multiple Live Births   3   2          Obstetric Comments   Has 2 adopted children       Home Medications    Prior to Admission medications   Medication Sig Start Date End Date Taking? Authorizing Provider    buPROPion (WELLBUTRIN XL) 150 MG 24 hr tablet Take 2 tablets daily (300 mg) 11/13/15   Hali Marry, MD  Cholecalciferol (D 2000) 2000 UNITS TABS Take 2,000 Units by mouth daily.     [provider]  Coenzyme Q10 (CVS COENZYME Q10) 200 MG capsule Take 200 mg by mouth daily.     [provider]  Lutein 6 MG CAPS Take 1 capsule by mouth daily.     [provider]  Multiple Vitamin (MULTIVITAMIN) capsule Take 1 capsule by mouth daily.     [provider]  Omega-3 Fatty Acids (OMEGA 3 500 PO) Take 1 tablet by mouth daily.     [provider]  SYNTHROID 112 MCG tablet Take 1 tablet (112 mcg total) by mouth daily. 10/13/15   Hali Marry, MD    Family History Family History  Problem Relation Age of Onset  . Depression Father        attempted suicide  . Hypertension Father   . Colon polyps Father   . Lung cancer Father   . Depression Sister        suidcide  . Mitral valve prolapse Mother   . Hypothyroidism Mother   . Colon polyps Mother   . Colon cancer Neg Hx   .  Kidney disease Neg Hx   . Gallbladder disease Neg Hx   . Diabetes Neg Hx   . Esophageal cancer Neg Hx     Social History Social History  Substance Use Topics  . Smoking status: Never Smoker  . Smokeless tobacco: Never Used  . Alcohol use Yes     Comment: very rarely     Allergies   Sulfonamide derivatives and Tamiflu [oseltamivir phosphate]   Review of Systems Review of Systems  Musculoskeletal: Positive for arthralgias.       Right knee pain.    A complete 10 system review of systems was obtained and all systems are negative except as noted in the HPI and PMH.    Physical Exam Updated Vital Signs BP (!) 148/75   Pulse 86   Temp 98.5 F (36.9 C) (Oral)   Resp 16   Ht 5\' 4"  (1.626 m)   Wt 142 lb (64.4 kg)   LMP 08/16/1998   SpO2 99%   BMI 24.37 kg/m   Physical Exam  Constitutional: She is oriented to person, place, and time. Vital signs  are normal. She appears well-developed and well-nourished.  HENT:  Head: Normocephalic and atraumatic.  Right Ear: Hearing normal.  Left Ear: Hearing normal.  Eyes: Conjunctivae and EOM are normal. Pupils are equal, round, and reactive to light. Right eye exhibits no discharge. Left eye exhibits no discharge. No scleral icterus.  Neck: Normal range of motion.  Cardiovascular: Normal rate and regular rhythm.   Pulmonary/Chest: Effort normal.  Abdominal: She exhibits no distension.  Musculoskeletal: Normal range of motion.  Right Knee: Negative anterior/poster drawer bilaterally. Negative ballottement test. No varus or valgus laxity. No crepitus. Pain noted with flexion or extension. No TTP of knees or ankles. Positive McMurray  Neurological: She is alert and oriented to person, place, and time.  Skin: Skin is warm and dry.  Psychiatric: She has a normal mood and affect. Her speech is normal and behavior is normal. Thought content normal.  Nursing note and vitals reviewed.   ED Treatments / Results  DIAGNOSTIC STUDIES: Oxygen Saturation is 99% on RA, normal by my interpretation.   COORDINATION OF CARE: 6:38 PM-Discussed next steps with pt. Pt verbalized understanding and is agreeable with the plan. ' Labs (all labs ordered are listed, but only abnormal results are displayed) Labs Reviewed - No data to display  EKG  EKG Interpretation None       Radiology Dg Knee Complete 4 Views Right  Result Date: 01/10/2017 CLINICAL DATA:  Right knee pain after stepping up a step. EXAM: RIGHT KNEE - COMPLETE 4+ VIEW COMPARISON:  None. FINDINGS: No evidence of fracture, dislocation, or joint effusion. No evidence of arthropathy or other focal bone abnormality. The patella is in anatomically expected position. Soft tissues are unremarkable. IMPRESSION: No acute osseous abnormality, joint effusion or dislocation. Electronically Signed   By: Ashley Royalty M.D.   On: 01/10/2017 18:23     Procedures Procedures (including critical care time)  Medications Ordered in ED Medications - No data to display   Initial Impression / Assessment and Plan / ED Course  I have reviewed the triage vital signs and the nursing notes.  Pertinent labs & imaging results that were available during my care of the patient were reviewed by me and considered in my medical decision making (see chart for details).  Final Clinical Impressions(s) / ED Diagnoses  I have reviewed and evaluated the relevant imaging studies.  I have reviewed the  relevant previous healthcare records. I obtained HPI from historian.  ED Course:  Assessment: Patient X-Ray negative for obvious fracture or dislocation. Suspect meniscal tear due to positive McMurray. Ligaments intact. Pt advised to follow up with orthopedics. Patient given brace while in ED, conservative therapy recommended and discussed. Patient will be discharged home & is agreeable with above plan. Returns precautions discussed. Pt appears safe for discharge.   Disposition/Plan:  DC Home Additional Verbal discharge instructions given and discussed with patient.  Pt Instructed to f/u with Ortho in the next week for evaluation and treatment of symptoms. Return precautions given Pt acknowledges and agrees with plan  Supervising Physician Malvin Johns, MD   Final diagnoses:  Acute pain of right knee    New Prescriptions New Prescriptions   IBUPROFEN (ADVIL,MOTRIN) 600 MG TABLET    Take 1 tablet (600 mg total) by mouth every 6 (six) hours as needed.   I personally performed the services described in this documentation, which was scribed in my presence. The recorded information has been reviewed and is accurate.    Shary Decamp, PA-C 01/10/17 Valerie Roys    Malvin Johns, MD 01/10/17 2003

## 2017-01-10 NOTE — ED Notes (Signed)
ED Provider at bedside. 

## 2017-02-14 ENCOUNTER — Other Ambulatory Visit: Payer: Self-pay | Admitting: Family Medicine

## 2017-05-05 ENCOUNTER — Other Ambulatory Visit: Payer: Self-pay | Admitting: Family Medicine

## 2017-05-13 ENCOUNTER — Ambulatory Visit (INDEPENDENT_AMBULATORY_CARE_PROVIDER_SITE_OTHER): Payer: 59 | Admitting: Sports Medicine

## 2017-05-13 ENCOUNTER — Encounter: Payer: Self-pay | Admitting: Sports Medicine

## 2017-05-13 DIAGNOSIS — Z23 Encounter for immunization: Secondary | ICD-10-CM

## 2017-05-13 DIAGNOSIS — M25561 Pain in right knee: Secondary | ICD-10-CM | POA: Diagnosis not present

## 2017-05-13 DIAGNOSIS — G8929 Other chronic pain: Secondary | ICD-10-CM | POA: Diagnosis not present

## 2017-05-13 NOTE — Assessment & Plan Note (Addendum)
Suspect lateral meniscal tear.  X-rays were negative, injection as above, home rehabilitation exercises for meniscal tear, return in one month.

## 2017-05-13 NOTE — Progress Notes (Signed)
   Subjective:    I'm seeing this patient as a consultation for:  Dr. Beatrice Lecher  CC: Right knee pain  HPI: This is a pleasant 61 year old female, for the past several months she's had pain that she localizes along the anterolateral aspect of the right knee, occurred after stepping up a step. She had immediate pain, mild swelling. She was seen by orthopedic surgeon who suggested an MRI with a questionable meniscal tear. No mechanical symptoms, chest pain, no swelling. Moderate, persistent without radiation.  Past medical history, Surgical history, Family history not pertinant except as noted below, Social history, Allergies, and medications have been entered into the medical record, reviewed, and no changes needed.   Review of Systems: No headache, visual changes, nausea, vomiting, diarrhea, constipation, dizziness, abdominal pain, skin rash, fevers, chills, night sweats, weight loss, swollen lymph nodes, body aches, joint swelling, muscle aches, chest pain, shortness of breath, mood changes, visual or auditory hallucinations.   Objective:   General: Well Developed, well nourished, and in no acute distress.  Neuro:  Extra-ocular muscles intact, able to move all 4 extremities, sensation grossly intact.  Deep tendon reflexes tested were normal. Psych: Alert and oriented, mood congruent with affect. ENT:  Ears and nose appear unremarkable.  Hearing grossly normal. Neck: Unremarkable overall appearance, trachea midline.  No visible thyroid enlargement. Eyes: Conjunctivae and lids appear unremarkable.  Pupils equal and round. Skin: Warm and dry, no rashes noted.  Cardiovascular: Pulses palpable, no extremity edema. Left Knee: Normal to inspection with no erythema or effusion or obvious bony abnormalities. Tender to palpation at the anterolateral joint line with reflux of pain with terminal flexion. ROM normal in flexion and extension and lower leg rotation. Ligaments with solid  consistent endpoints including ACL, PCL, LCL, MCL. Negative Mcmurray's and provocative meniscal tests. Non painful patellar compression. Patellar and quadriceps tendons unremarkable. Hamstring and quadriceps strength is normal.  X-rays reviewed and are negative  Procedure: Real-time Ultrasound Guided Injection of right knee Device: GE Logiq E  Verbal informed consent obtained.  Time-out conducted.  Noted no overlying erythema, induration, or other signs of local infection.  Skin prepped in a sterile fashion.  Local anesthesia: Topical Ethyl chloride.  With sterile technique and under real time ultrasound guidance:  1 mL Kenalog 40, 2 mL lidocaine, 2 mL bupivacaine injected easily into the suprapatellar recess Completed without difficulty  Pain immediately resolved suggesting accurate placement of the medication.  Advised to call if fevers/chills, erythema, induration, drainage, or persistent bleeding.  Images permanently stored and available for review in the ultrasound unit.  Impression: Technically successful ultrasound guided injection.  Impression and Recommendations:   This case required medical decision making of moderate complexity.  Right knee pain Suspect lateral meniscal tear.  X-rays were negative, injection as above, home rehabilitation exercises for meniscal tear, return in one month. ___________________________________________ Gwen Her. Dianah Field, M.D., ABFM., CAQSM. Primary Care and [place] Instructor of [place] of [facility] of Medicine

## 2017-05-16 ENCOUNTER — Encounter: Payer: Self-pay | Admitting: Sports Medicine

## 2017-05-16 ENCOUNTER — Ambulatory Visit (INDEPENDENT_AMBULATORY_CARE_PROVIDER_SITE_OTHER): Payer: 59 | Admitting: Sports Medicine

## 2017-05-16 DIAGNOSIS — R269 Unspecified abnormalities of gait and mobility: Secondary | ICD-10-CM | POA: Diagnosis not present

## 2017-05-16 NOTE — Assessment & Plan Note (Addendum)
Custom orthotics as above. The patient ambulated these, and they were comfortable, there was an odd sensation in the lateral left heel, I tried planing down the back of the orthotics but she still could feel it.  She will try it in different shoes.

## 2017-05-16 NOTE — Progress Notes (Signed)
    Patient was fitted for a : standard, cushioned, semi-rigid orthotic. The orthotic was heated and afterward the patient stood on the orthotic blank positioned on the orthotic stand. The patient was positioned in subtalar neutral position and 10 degrees of ankle dorsiflexion in a weight bearing stance. After completion of molding, a stable base was applied to the orthotic blank. The blank was ground to a stable position for weight bearing. Size: 7 Base: [employer] Health and safety inspector and Padding: None. The patient ambulated these, and they were comfortable, there was an odd sensation in the lateral left heel, I tried planing down the back of the orthotics but she still could feel it.  She will try it in different shoes.  I spent 40 minutes with this patient, greater than 50% was face-to-face time counseling regarding the below diagnosis.  ___________________________________________ Stacey Contreras. Dianah Field, M.D., ABFM., CAQSM. Primary Care and [place] Instructor of [place] of [facility] of Medicine

## 2017-05-17 ENCOUNTER — Ambulatory Visit (INDEPENDENT_AMBULATORY_CARE_PROVIDER_SITE_OTHER): Payer: 59 | Admitting: Sports Medicine

## 2017-05-17 DIAGNOSIS — R269 Unspecified abnormalities of gait and mobility: Secondary | ICD-10-CM

## 2017-05-17 NOTE — Assessment & Plan Note (Signed)
New set of custom orthotics to replace the ones made yesterday.   Metatarsal pads placed, these were comfortable. No charge for this visit.

## 2017-05-17 NOTE — Progress Notes (Signed)
    Patient was fitted for a : standard, cushioned, semi-rigid orthotic. The orthotic was heated and afterward the patient stood on the orthotic blank positioned on the orthotic stand. The patient was positioned in subtalar neutral position and 10 degrees of ankle dorsiflexion in a weight bearing stance. After completion of molding, a stable base was applied to the orthotic blank. The blank was ground to a stable position for weight bearing. Size: 7 Base: White Health and safety inspector and Padding: Bilateral medium metatarsal pads. The patient ambulated these, and they were very comfortable.  I spent 40 minutes with this patient, greater than 50% was face-to-face time counseling regarding the below diagnosis.  ___________________________________________ Gwen Her. Dianah Field, M.D., ABFM., CAQSM. Primary Care and [place] Instructor of [place] of [facility] of Medicine

## 2017-05-31 ENCOUNTER — Ambulatory Visit (INDEPENDENT_AMBULATORY_CARE_PROVIDER_SITE_OTHER): Payer: 59 | Admitting: Family Medicine

## 2017-05-31 DIAGNOSIS — J0100 Acute maxillary sinusitis, unspecified: Secondary | ICD-10-CM

## 2017-05-31 MED ORDER — AMOXICILLIN-POT CLAVULANATE 875-125 MG PO TABS: 1.0000 | ORAL_TABLET | Freq: Two times a day (BID) | ORAL | 0 refills | Status: DC
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
## 2017-05-31 NOTE — Patient Instructions (Addendum)

## 2017-06-08 ENCOUNTER — Telehealth: Payer: Self-pay | Admitting: *Deleted

## 2017-06-08 MED ORDER — AZITHROMYCIN 250 MG PO TABS: ORAL_TABLET | ORAL | 0 refills | Status: AC

## 2017-06-08 NOTE — Telephone Encounter (Signed)
Okay, will send her a prescription for azithromycin.

## 2017-06-08 NOTE — Telephone Encounter (Signed)
Pt advised.

## 2017-06-08 NOTE — Telephone Encounter (Signed)
Patient called and states she had an allergic reaction to amoxicillin that was prescribed last week.she started having itching on her skin all over. She has since stopped the antibiotic. She says she has a severe headache and congestion and needs something else to be called into her pharmacy.please advise. augmentin was added to her allergy list.

## 2017-06-10 ENCOUNTER — Ambulatory Visit (INDEPENDENT_AMBULATORY_CARE_PROVIDER_SITE_OTHER): Payer: 59 | Admitting: Sports Medicine

## 2017-06-10 VITALS — BP 108/75 | HR 105 | Ht 64.02 in | Wt 144.0 lb

## 2017-06-10 DIAGNOSIS — M25561 Pain in right knee: Secondary | ICD-10-CM

## 2017-06-10 DIAGNOSIS — G8929 Other chronic pain: Secondary | ICD-10-CM | POA: Diagnosis not present

## 2017-06-10 NOTE — Assessment & Plan Note (Signed)
Doing well after injection at the last visit, custom orthotics as above.

## 2017-06-10 NOTE — Progress Notes (Addendum)
    Patient was fitted for a : standard, cushioned, semi-rigid orthotic. The orthotic was heated and afterward the patient stood on the orthotic blank positioned on the orthotic stand. The patient was positioned in subtalar neutral position and 10 degrees of ankle dorsiflexion in a weight bearing stance. After completion of molding, a stable base was applied to the orthotic blank. The blank was ground to a stable position for weight bearing. Size: 7 Base: White Health and safety inspector and Padding: medium metatarsal pads The patient ambulated these, and they were very comfortable.  I spent 40 minutes with this patient, greater than 50% was face-to-face time counseling regarding the below diagnosis.  ___________________________________________ Gwen Her. Dianah Field, M.D., ABFM., CAQSM. Primary Care and [place] Instructor of [place] of [facility] of Medicine

## 2017-07-18 ENCOUNTER — Other Ambulatory Visit: Payer: Self-pay | Admitting: Family Medicine

## 2017-08-02 ENCOUNTER — Other Ambulatory Visit: Payer: Self-pay | Admitting: Family Medicine

## 2017-08-02 DIAGNOSIS — Z1239 Encounter for other screening for malignant neoplasm of breast: Secondary | ICD-10-CM

## 2017-08-04 ENCOUNTER — Ambulatory Visit (INDEPENDENT_AMBULATORY_CARE_PROVIDER_SITE_OTHER): Payer: 59

## 2017-08-04 DIAGNOSIS — Z1231 Encounter for screening mammogram for malignant neoplasm of breast: Secondary | ICD-10-CM

## 2017-08-04 DIAGNOSIS — Z1239 Encounter for other screening for malignant neoplasm of breast: Secondary | ICD-10-CM

## 2017-08-11 ENCOUNTER — Ambulatory Visit: Payer: 59

## 2017-08-23 ENCOUNTER — Ambulatory Visit (INDEPENDENT_AMBULATORY_CARE_PROVIDER_SITE_OTHER): Payer: 59 | Admitting: Family Medicine

## 2017-08-23 ENCOUNTER — Encounter: Payer: Self-pay | Admitting: Family Medicine

## 2017-08-23 VITALS — BP 130/62 | HR 90

## 2017-08-23 DIAGNOSIS — E039 Hypothyroidism, unspecified: Secondary | ICD-10-CM | POA: Diagnosis not present

## 2017-08-23 DIAGNOSIS — L659 Nonscarring hair loss, unspecified: Secondary | ICD-10-CM

## 2017-08-23 DIAGNOSIS — R5383 Other fatigue: Secondary | ICD-10-CM

## 2017-08-23 NOTE — Progress Notes (Signed)
Subjective:    Patient ID: Stacey Contreras, female    DOB: 08/20/1955, 62 y.o.   MRN: [id number]  HPI 62 year old female comes in today to discuss hair loss.  She feels like it could be that her thyroid soft.  She is hypothyroid and currently takes Synthroid 112 mcg daily.  Over the last couple months she has been noticing her hair is been coming out more and even her hairdresser has noticed.  Couple days ago she woke up with a couple of red spots on her left forearm and she is not sure what may have caused it.  They are not itchy.  She does not have a rash anywhere else but has noticed a little bit more itching over her abdomen.  She says she always has dry skin.  She is been having more headaches and sinus pressure and also complains of low energy.  She has been under a lot of stress.  Should be moving out and moving out [place] in a couple of months.  She is in the middle of closing on her home and her mother who has advancing dementia is now in a nursing home but having some behavioral problems.   Review of Systems  BP 130/62   Pulse 90   LMP 08/16/1998   SpO2 99%     Allergies  Allergen Reactions  . Augmentin [Amoxicillin-Pot Clavulanate] Itching  . Sulfonamide Derivatives   . Tamiflu [Oseltamivir Phosphate] Other (See Comments)    Irritabilty, mood change     Past Medical History:  Diagnosis Date  . Arthritis   . Depression   . Elevated lipids   . Gallstones   . Hypothyroidism   . Thyroid disease     Past Surgical History:  Procedure Laterality Date  . APPENDECTOMY    . CESAREAN SECTION    . CHOLECYSTECTOMY    . VAGINAL HYSTERECTOMY     Due to dysmenorrhea    Social History   Socioeconomic History  . Marital status: Married    Spouse name: Not on file  . Number of children: 2  . Years of education: Not on file  . Highest education level: Not on file  Social Needs  . Financial resource strain: Not on file  . Food insecurity - worry: Not on file  . Food insecurity  - inability: Not on file  . Transportation needs - medical: Not on file  . Transportation needs - non-medical: Not on file  Occupational History  . Occupation: Air traffic controller  Tobacco Use  . Smoking status: Never Smoker  . Smokeless tobacco: Never Used  Substance and Sexual Activity  . Alcohol use: Yes    Comment: very rarely  . Drug use: No  . Sexual activity: Yes    Partners: Male  Other Topics Concern  . Not on file  Social History Narrative   She is working our regularly.      Family History  Problem Relation Age of Onset  . Depression Father        attempted suicide  . Hypertension Father   . Colon polyps Father   . Lung cancer Father   . Depression Sister        suidcide  . Mitral valve prolapse Mother   . Hypothyroidism Mother   . Colon polyps Mother   . Colon cancer Neg Hx   . Kidney disease Neg Hx   . Gallbladder disease Neg Hx   . Diabetes Neg Hx   . Esophageal  cancer Neg Hx     Outpatient Encounter Medications as of 08/23/2017  Medication Sig  . buPROPion (WELLBUTRIN XL) 150 MG 24 hr tablet TAKE 2 TABLETS BY MOUTH  DAILY  . Cholecalciferol (D 2000) 2000 UNITS TABS Take 2,000 Units by mouth daily.   . Coenzyme Q10 ([employer] COENZYME Q10) 200 MG capsule Take 200 mg by mouth daily.   Marland Kitchen ibuprofen (ADVIL,MOTRIN) 600 MG tablet Take 1 tablet (600 mg total) by mouth every 6 (six) hours as needed.  . Lutein 6 MG CAPS Take 1 capsule by mouth daily.   . Multiple Vitamin (MULTIVITAMIN) capsule Take 1 capsule by mouth daily.   . Omega-3 Fatty Acids (OMEGA 3 500 PO) Take 1 tablet by mouth daily.   Marland Kitchen SYNTHROID 112 MCG tablet Take 1 tablet (112 mcg total) by mouth daily.   No facility-administered encounter medications on file as of 08/23/2017.          Objective:   Physical Exam  Constitutional: She is oriented to person, place, and time. She appears well-developed and well-nourished.  HENT:  Head: Normocephalic and atraumatic.  No thyromegaly.    Neck: Neck  supple. No thyromegaly present.  Cardiovascular: Normal rate, regular rhythm and normal heart sounds.  Pulmonary/Chest: Effort normal and breath sounds normal.  Lymphadenopathy:    She has no cervical adenopathy.  Neurological: She is alert and oriented to person, place, and time.  Skin: Skin is warm and dry.  She has 2 approximately 3 mm erythematous spots on her left wrist.  They almost look like the skin was traumatized and was like there is blood underneath the skin.  Psychiatric: She has a normal mood and affect. Her behavior is normal.       Assessment & Plan:  Hair loss-unclear etiology.  It may just be stress related but we will go ahead and check her thyroid against she is hypothyroid make sure that she is being adequately treated.  We will also check for anemia with a CBC as well as electrolyte disturbance.  Next  Hypothyroidism-due to recheck TSH is been almost a year since we checked it.  Stress-just continue to work on getting adequate sleep rest regular exercise and work on stress reduction.  She feels like when she gets moved in a couple months and settle down things will get better.  Fatigue-again suspect probably related to stress levels.  She says she tends to sleep more when she is more stressed.  But again we will evaluate for thyroid anemia etc.

## 2017-08-24 LAB — CBC
Hematocrit: 43.3 % (ref 34.0–46.6)
Hemoglobin: 14.8 g/dL (ref 11.1–15.9)
MCH: 32.7 pg (ref 26.6–33.0)
MCHC: 34.2 g/dL (ref 31.5–35.7)
MCV: 96 fL (ref 79–97)
Platelets: 381 10*3/uL — ABNORMAL HIGH (ref 150–379)
RBC: 4.53 x10E6/uL (ref 3.77–5.28)
RDW: 13.2 % (ref 12.3–15.4)
WBC: 5.9 10*3/uL (ref 3.4–10.8)

## 2017-08-24 LAB — CMP14+EGFR
ALT: 18 IU/L (ref 0–32)
AST: 19 IU/L (ref 0–40)
Albumin/Globulin Ratio: 2 (ref 1.2–2.2)
Albumin: 4.6 g/dL (ref 3.6–4.8)
Alkaline Phosphatase: 76 IU/L (ref 39–117)
BUN/Creatinine Ratio: 12 (ref 12–28)
BUN: 11 mg/dL (ref 8–27)
Bilirubin Total: <0.2 mg/dL (ref 0.0–1.2)
CO2: 26 mmol/L (ref 20–29)
Calcium: 9.6 mg/dL (ref 8.7–10.3)
Chloride: 102 mmol/L (ref 96–106)
Creatinine, Ser: 0.93 mg/dL (ref 0.57–1.00)
GFR calc Af Amer: 77 mL/min/{1.73_m2} (ref >59)
GFR calc non Af Amer: 67 mL/min/{1.73_m2} (ref >59)
Globulin, Total: 2.3 g/dL (ref 1.5–4.5)
Glucose: 101 mg/dL — ABNORMAL HIGH (ref 65–99)
Potassium: 4.5 mmol/L (ref 3.5–5.2)
Sodium: 144 mmol/L (ref 134–144)
Total Protein: 6.9 g/dL (ref 6.0–8.5)

## 2017-08-24 LAB — TSH: TSH: 0.958 u[IU]/mL (ref 0.450–4.500)

## 2017-08-24 NOTE — Progress Notes (Signed)
All labs are normal. 

## 2017-09-29 ENCOUNTER — Other Ambulatory Visit: Payer: Self-pay | Admitting: Family Medicine

## 2018-03-05 ENCOUNTER — Other Ambulatory Visit: Payer: Self-pay | Admitting: Family Medicine

## 2021-05-12 ENCOUNTER — Encounter: Payer: Self-pay | Admitting: Internal Medicine
